# Patient Record
Sex: Female | Born: 1935 | Race: Black or African American | Hispanic: No | Marital: Single | Smoking: Former smoker
Health system: Southern US, Community
[De-identification: ages and names within clinical notes are randomized; demographics above are authoritative.]

## PROBLEM LIST (undated history)

## (undated) ENCOUNTER — Emergency Department (HOSPITAL_COMMUNITY): Admission: EM | Discharge status: Against Medical Advice | Payer: Medicare Other

## (undated) DIAGNOSIS — I10 Essential (primary) hypertension: Secondary | ICD-10-CM

## (undated) DIAGNOSIS — C801 Malignant (primary) neoplasm, unspecified: Secondary | ICD-10-CM

## (undated) HISTORY — PX: ABDOMINAL HYSTERECTOMY: SHX81

## (undated) HISTORY — PX: CERVICAL FUSION: SHX112

## (undated) HISTORY — PX: BREAST LUMPECTOMY: SHX2

## (undated) HISTORY — PX: OTHER SURGICAL HISTORY: SHX169

---

## 1998-11-04 ENCOUNTER — Ambulatory Visit (HOSPITAL_COMMUNITY): Admission: RE | Admit: 1998-11-04 | Discharge: 1998-11-04 | Payer: Self-pay | Admitting: Gastroenterology

## 1998-11-23 ENCOUNTER — Ambulatory Visit (HOSPITAL_COMMUNITY): Admission: RE | Admit: 1998-11-23 | Discharge: 1998-11-23 | Payer: Self-pay | Admitting: Gastroenterology

## 1998-11-23 ENCOUNTER — Encounter: Payer: Self-pay | Admitting: Gastroenterology

## 1999-04-04 ENCOUNTER — Emergency Department (HOSPITAL_COMMUNITY): Admission: EM | Admit: 1999-04-04 | Discharge: 1999-04-04 | Payer: Self-pay | Admitting: Emergency Medicine

## 1999-04-04 ENCOUNTER — Encounter: Payer: Self-pay | Admitting: Emergency Medicine

## 1999-04-06 ENCOUNTER — Emergency Department (HOSPITAL_COMMUNITY): Admission: EM | Admit: 1999-04-06 | Discharge: 1999-04-06 | Payer: Self-pay | Admitting: Emergency Medicine

## 1999-04-14 ENCOUNTER — Emergency Department (HOSPITAL_COMMUNITY): Admission: EM | Admit: 1999-04-14 | Discharge: 1999-04-14 | Payer: Self-pay | Admitting: Emergency Medicine

## 2000-03-27 ENCOUNTER — Ambulatory Visit (HOSPITAL_COMMUNITY): Admission: RE | Admit: 2000-03-27 | Discharge: 2000-03-27 | Payer: Self-pay | Admitting: Gastroenterology

## 2000-03-27 ENCOUNTER — Encounter (INDEPENDENT_AMBULATORY_CARE_PROVIDER_SITE_OTHER): Payer: Self-pay | Admitting: Specialist

## 2001-07-07 ENCOUNTER — Encounter: Admission: RE | Admit: 2001-07-07 | Discharge: 2001-07-07 | Payer: Self-pay | Admitting: Internal Medicine

## 2001-07-07 ENCOUNTER — Encounter: Payer: Self-pay | Admitting: Internal Medicine

## 2001-07-07 ENCOUNTER — Encounter: Admission: RE | Admit: 2001-07-07 | Discharge: 2001-10-05 | Payer: Self-pay | Admitting: Internal Medicine

## 2002-04-07 ENCOUNTER — Encounter: Payer: Self-pay | Admitting: Neurosurgery

## 2002-04-07 ENCOUNTER — Ambulatory Visit (HOSPITAL_COMMUNITY): Admission: RE | Admit: 2002-04-07 | Discharge: 2002-04-07 | Payer: Self-pay | Admitting: Neurosurgery

## 2002-05-05 ENCOUNTER — Inpatient Hospital Stay (HOSPITAL_COMMUNITY): Admission: RE | Admit: 2002-05-05 | Discharge: 2002-05-07 | Payer: Self-pay | Admitting: Neurosurgery

## 2002-05-05 ENCOUNTER — Encounter: Payer: Self-pay | Admitting: Neurosurgery

## 2003-05-24 ENCOUNTER — Ambulatory Visit (HOSPITAL_COMMUNITY): Admission: RE | Admit: 2003-05-24 | Discharge: 2003-05-24 | Payer: Self-pay | Admitting: Gastroenterology

## 2004-07-08 ENCOUNTER — Inpatient Hospital Stay (HOSPITAL_COMMUNITY): Admission: EM | Admit: 2004-07-08 | Discharge: 2004-07-11 | Payer: Self-pay | Admitting: Emergency Medicine

## 2008-02-12 ENCOUNTER — Encounter (INDEPENDENT_AMBULATORY_CARE_PROVIDER_SITE_OTHER): Payer: Self-pay | Admitting: *Deleted

## 2008-02-12 ENCOUNTER — Ambulatory Visit (HOSPITAL_COMMUNITY): Admission: RE | Admit: 2008-02-12 | Discharge: 2008-02-12 | Payer: Self-pay | Admitting: *Deleted

## 2008-02-16 ENCOUNTER — Ambulatory Visit (HOSPITAL_COMMUNITY): Admission: RE | Admit: 2008-02-16 | Discharge: 2008-02-16 | Payer: Self-pay | Admitting: *Deleted

## 2010-08-25 ENCOUNTER — Emergency Department (HOSPITAL_COMMUNITY): Admission: EM | Admit: 2010-08-25 | Discharge: 2010-08-25 | Payer: Self-pay | Admitting: Family Medicine

## 2010-12-26 LAB — POCT URINALYSIS DIPSTICK
Bilirubin Urine: NEGATIVE
Ketones, ur: NEGATIVE mg/dL
Nitrite: POSITIVE — AB
Protein, ur: NEGATIVE mg/dL
Urobilinogen, UA: 0.2 mg/dL (ref 0.0–1.0)

## 2011-02-27 NOTE — Op Note (Signed)
NAMECARIGAN, Penny NO.:  [id number]   MEDICAL RECORD NO.:  [id number]          PATIENT TYPE:  AMB   LOCATION:  ENDO                         FACILITY:  [facility]   PHYSICIAN:  Georgiana Spinner, M.D.    DATE OF BIRTH:  Oct 20, 1933   DATE OF PROCEDURE:  DATE OF DISCHARGE:                               OPERATIVE REPORT   PROCEDURE:  Colonoscopy.   INDICATIONS:  Left upper quadrant abdominal pain.   ANESTHESIA:  Fentanyl 25 mcg, Versed 3 mg.   PROCEDURE:  With the patient mildly sedated in the left lateral  decubitus position, the Pentax videoscopic colonoscope was inserted in  the rectum, passed under direct vision with pressure applied. We reached  the cecum identified by the ileocecal valve and appendiceal orifice both  of which were photographed. From this point, the colonoscope was slowly  withdrawn taking circumferential views of colonic mucosa stopping only  at approximately 25 cm from the anal verge at which point a polyp was  seen, photographed and removed using hot biopsy forceps technique on a  setting of 20/150 blended current.  We next stopped in the rectum which  appeared normal on direct and showed hemorrhoids on retroflexed view  which was also removed in the same manner. The endoscope was  straightened and withdrawn. The patient's vital signs and pulse oximeter  remained stable. The patient tolerated the procedure well without  apparent complications.   FINDINGS:  Polyp of rectum, polyp at 25-30 cm from the anal verge.   PLAN:  Await biopsy report.  The patient will call me for results and  follow-up with me as needed as an outpatient.  Additionally, we will  plan on doing a CT scan of the abdomen to evaluate her left upper  quadrant pain further. The patient is status post breast cancer.           ______________________________  Georgiana Spinner, M.D.     GMO/MEDQ  D:  02/12/2008  T:  02/12/2008  Job:  [id number]

## 2011-02-27 NOTE — Op Note (Signed)
NAMECYRIAH, CHILDREY NO.:  [id number]   MEDICAL RECORD NO.:  [id number]          PATIENT TYPE:  AMB   LOCATION:  ENDO                         FACILITY:  [facility]   PHYSICIAN:  Georgiana Spinner, M.D.    DATE OF BIRTH:  [birth year]   DATE OF PROCEDURE:  02/12/2008  DATE OF DISCHARGE:                               OPERATIVE REPORT   PROCEDURE:  Upper endoscopy.   INDICATIONS:  Left upper quadrant abdominal pain.   ANESTHESIA:  Fentanyl 50 mcg, Versed 5 mg.   PROCEDURE IN DETAIL:  With the patient mildly sedated in the left  lateral decubitus position the Pentax videoscopic endoscope was inserted  in the mouth, passed under direct vision through the esophagus which  appeared normal into the stomach, fundus, body, antrum, duodenal bulb,  second portion of duodenum were visualized and all appeared normal.  From this point the endoscope was slowly withdrawn taking  circumferential views of duodenal mucosa until the endoscope had been  pulled back into the stomach and placed in retroflexion to view the  stomach from below.  The endoscope was straightened and withdrawn taking  circumferential views of remaining gastric and esophageal mucosa.  The  patient's vital signs, pulse oximeter remained stable.  The patient  tolerated the procedure well without apparent complication.   FINDINGS:  Negative examination.   PLAN:  Proceed to colonoscopy.           ______________________________  Georgiana Spinner, M.D.     GMO/MEDQ  D:  02/12/2008  T:  02/12/2008  Job:  [id number]   cc:   Candyce Churn. Allyne Gee, M.D.  Fax: [phone]

## 2011-03-02 NOTE — Op Note (Signed)
[name], Mathews               ACCOUNT NO.:  [id number]   MEDICAL RECORD NO.:  [id number]          PATIENT TYPE:  INP   LOCATION:  1825                         FACILITY:  [facility]   PHYSICIAN:  Lubertha Basque. Dalldorf, M.D.DATE OF BIRTH:  22-Mar-1934   DATE OF PROCEDURE:  07/08/2004  DATE OF DISCHARGE:                                 OPERATIVE REPORT   PREOPERATIVE DIAGNOSES:  1.  Left tibia and fibula shaft fractures.  2.  Left ankle lateral malleolus fracture.   POSTOPERATIVE DIAGNOSES:  1.  Left tibia and fibula shaft fractures.  2.  Left ankle lateral malleolus fracture.   PROCEDURE:  1.  Left tibia IM nail.  2.  ORIF left ankle.   ANESTHESIA:  General.   ATTENDING SURGEON:  Lubertha Basque. Jerl Santos, M.D.   ASSISTANT:  Prince Rome, P.A.   INDICATION FOR PROCEDURE:  The patient is a 77 year old woman, who fell off  a ladder this afternoon and sustained mid shaft tibia and fibula fractures  along with a distal fibula fracture.  She had a significantly displaced  ankle fracture along with her tibial shaft fracture and was offered  operative intervention to consist of intramedullary nailing of the tibia and  ORIF of the ankle.  Informed operative consent was obtained after discussion  of possible complications of reaction to anesthesia, infection,  neurovascular injury, stiffness, and nonunion.   DESCRIPTION OF PROCEDURE:  The patient was taken to the operating suite  where general anesthetic was applied without difficulty.  She was positioned  supine and prepped and draped in the normal sterile fashion.  After the  administration of IV antibiotics, the proximal tibia was approached through  a medial incision along the border of the patellar tendon.  Dissection was  carried down to an area just proximal to the tibial tubercle.  An awl was  used to enter the tibia following placement of a guidewire.  This was then  overreamed with some difficulty up to 11 mm.  She had a very  tight canal  even with the 9 mm reamer.  We then placed a __________ tibial nail which  was 10 mm in diameter and 32 cm in length.  We then locked this proximally  with two screws and distally with two screws with bicortical purchase  achieved on each.  I used fluoroscopy throughout this portion of the case to  make appropriate intraoperative incisions __________ myself.  She then had  an unstable ankle fracture.  I performed a lateral approach to the distal  fibula.  We aligned this as well as we could and then stabilized it with a 6-  hole locking plate from [employer].  Five of the screw holes with two distal  and three proximal screws.  Again, I used fluoroscopy to confirm adequate  reduction of this fracture and placed it within the hardware and read all  these views myself.  The wounds were then irrigated followed by  reapproximation of deep tissues with Vicryl and skin with staples.  Adaptic  was placed over the various wounds followed by dry gauze and a  loose Ace  wrap.  We did place a posterior splint of plaster on the ankle with the  joint in neutral position.  Estimated blood loss and intraoperative fluids  can be obtained from the anesthesia records.  No tourniquet was inflated.   DISPOSITION:  The patient was extubated in the operating room and taken to  the recovery room in stable condition.  Plans were for her to be admitted to  the orthopedic surgery service for appropriate postop care to include  perioperative antibiotics and a brief course of Lovenox for DVT prophylaxis.       PGD/MEDQ  D:  07/08/2004  T:  07/09/2004  Job:  [id number]

## 2011-03-02 NOTE — Op Note (Signed)
[facility]  Patient:    Penny Mathews, Penny Mathews I Visit Number: [id number] MRN: [id number]          Service Type: SUR Location: 3000 3020 [id number] Attending Physician:  Josie Saunders Dictated by:   Danae Orleans. Venetia Maxon, M.D. Proc. Date: 05/05/02 Admit Date:  05/05/2002 Discharge Date: 05/07/2002                             Operative Report  PREOPERATIVE DIAGNOSIS:  Herniated cervical disk with cervical spondylosis, degenerative disk disease, and radiculopathy at C3-4, C4-5, and C6-7, and prior fusion at C5-6.  POSTOPERATIVE DIAGNOSIS:  Herniated cervical disk with cervical spondylosis, degenerative disk disease, and radiculopathy at C3-4, C4-5, and C6-7, and prior fusion at C5-6.  PROCEDURE:  Anterior cervical diskectomy and fusion at C3-4, C4-5, and C6-7, with allograft bone graft and anterior cervical plate.  ADDITIONAL PROCEDURES:  Exploration and fusion at C5-6.  SURGEON:  Danae Orleans. Venetia Maxon, M.D.  ASSISTANTMarland Kitchen  Mena Goes. Franky Macho, M.D.  ANESTHESIA:  General endotracheal.  ESTIMATED BLOOD LOSS:  100 cc.  COMPLICATIONS:  None.  DISPOSITION:  To recovery.  INDICATIONS:  The patient is a 77 year old woman with a multi-level cervical spondylitic foraminal stenosis and cervical radiculopathy, who had previously undergone in the distant past a C5-6 anterior cervical fusion.  It was elected after she did not improve with prolonged conservative management to take her to surgery for anterior cervical diskectomy and fusion at the effected level.  DESCRIPTION OF PROCEDURE:  The patient was brought to the operating room. Following the satisfactory and uncomplicated induction of general endotracheal anesthesia and placement of intravenous lines and a Foley catheter, the patient was placed in the supine position on the operating table.  The neck was placed in slight extension.  She was placed in 10 pounds of halter traction.  Her anterior neck was then prepped  and draped in the usual sterile fashion.  An incision was marked on the left side of the neck, along the anterior border of the sternocleidomastoid muscle which was then infiltrated with 0.25% Marcaine, 0.5% lidocaine, and 1:200,000 epinephrine.  An incision was made and carried through the platysmal layer.  Subplatysmal dissection was performed and exposure was made of the anterior cervical spine.  Carotid sheath kept lateral.  Trachea and esophagus kept medial.  Initial needle was placed at what was felt to be the C4-5 level and this was confirmed on intraoperative x-ray.  Subsequently, the longus colli muscles were taken down from C3 to C5, and then from C6 to C7 bilaterally using electrocautery and Key elevator.  The C5-6 level was inspected and the fusion was explored and was found to be solid arthrodesis at this level.  Initially, at the C6-7 level, there was a large ventral osteophyte which was removed with Leksell rongeur.  Self-retaining retractors were placed to facilitate exposure.  The disk space was incised with a 15 blade.  Disk material was removed in a piecemeal fashion.  The endplates were stripped of cartilaginous and disk material with a variety of Carlens curets.  Attention was then turned to the C3-4 and C4-5 levels where similar exposure was performed.  Self-retaining retractor was placed along with up/down retractor.  The microscope was brought into the field and using the high speed drill and microscopic visualization, the endplates were decorticated at C3 and C4, and large uncinate spurs were drilled down.  This was particularly large on the  right side.  The posterior longitudinal ligament was then incised with the arachnoid knife and removed in a piecemeal fashion.  Both C4 neural foraminae were well-decompressed.  Hemostasis was assured and Gelfoam soaked in thrombin.  A trial sizer was used and found that a 7 mm graft would fit well.  An 8 mm graft was then  thinned to a thickness of 7 mm and inserted and counter sunk appropriately at the interspace.  Attention was then turned to the 4-5 level where a similar decompression was performed, and again, at this level, large bilateral uncinate spurs were identified, and drilled down, and the posterior longitudinal ligament incised and removed in a piecemeal fashion.  The neural foraminae were felt to be well-decompressed.  Care was taken not to instrument the neural foraminae out of concern for fragility of the C5 nerve roots, but both neural foraminae were well-decompressed and the large uncinate spurs were removed.  Hemostasis was again assured with Gelfoam soaked in thrombin and a similarly-sized bone graft was inserted and counter sunk appropriately.  Attention was then returned to the C6-7 level, where a disk space spreader was placed, and at this level, large uncinate spurs were identified, and drilled down under microscopic visualization.  The posterior longitudinal ligament was removed and the neural foraminae were decompressed bilaterally.  An 8 mm bone graft was then inserted and counter sunk appropriately at this level.  The anterior spine was then prepared for plating.  The microscope was taken out of the field, and initially a 26 mm Trinica anterior cervical plate was affixed to the cervical spine with two 14 mm screws at C6, two 14 mm variable screws at C7.  Locking mechanisms were engaged.  All the screws had excellent purchase.  A 44 mm plate was then affixed to the anterior spine at C3 through C5 level using similarly-sized screws.  All screws had excellent purchase. Final x-ray confirmed good position of bone graft and anterior cervical plate. The wound was copiously irrigated with Bacitracin and saline.  Soft tissues were reinspected and found to be in good repair.  The final x-ray demonstrates good position of bone graft and anterior cervical plate.  Then, the platysmal layer  was closed with 3-0 Vicryl sutures, and skin edges were reapproximated with running 2-0 Vicryl subcuticular stitch.   The wound  was dressed with Dermabond.  The patient was extubated in the operating room and taken to the recovery room in stable and satisfactory condition.  At the end of the operation, all counts were correct. Dictated by:   Danae Orleans Venetia Maxon, M.D. Attending Physician:  Josie Saunders DD:  05/05/02 TD:  05/08/02 Job: [id number]

## 2011-03-02 NOTE — Procedures (Signed)
[facility]  Patient:    Penny Mathews, Penny Mathews                   MRN: [id number] Proc. Date: 03/27/00 Adm. Date:  [id number] Disc. Date: [id number] Attending:  Louie Bun CC:         Julieanne Manson, M.D.                           Procedure Report  PROCEDURE:  Colonoscopy.  INDICATION FOR PROCEDURE:  History of adenomatous colon polyps 3 years ago.  DESCRIPTION OF PROCEDURE:  The patient was placed in the left lateral decubitus position and placed on the pulse monitor with continuous low flow oxygen delivered by nasal cannula. She was sedated with 50 mg IV Demerol and 5 mg IV Versed. The Olympus video colonoscope was inserted into the rectum and advanced to the cecum, confirmed by transillumination of McBurneys point and visualization of the ileocecal valve and appendiceal orifice. The prep was excellent. The cecum and ascending colon appeared normal with no masses, polyps, diverticula or other mucosal abnormalities. Within the transverse colon, there was seen an 8 mm polyp which was fulgurated by hot biopsy. Three additional polyps were seen scattered in the descending sigmoid and rectum and also removed by hot biopsy. These ranged in size from 0.42 to 1 cm in diameter. There may have been 2 or 3 diminutive rectal polyps that were not fulgurated. The colonoscope was then withdrawn and the patient returned to the recovery room in stable condition. The patient tolerated the procedure well and there were no immediate complications.  IMPRESSION:  Multiple small colon polyps from the transverse colon to the rectum.  PLAN:  Await histology and will probably repeat colonoscopy in 3 years. DD:  03/27/00 TD:  03/30/00 Job: [id number]

## 2011-03-02 NOTE — Op Note (Signed)
   NAME:  Penny Mathews, Penny Mathews                         ACCOUNT NO.:  [id number]   MEDICAL RECORD NO.:  [id number]                   PATIENT TYPE:  AMB   LOCATION:  ENDO                                 [facility]ACILITY[facility]  [facility]   PHYSICIAN:  John C. Madilyn Fireman, M.D.                 DATE OF BIRTH:  21-Aug-1934   DATE OF PROCEDURE:  05/24/2003  DATE OF DISCHARGE:                                 OPERATIVE REPORT   PROCEDURE:  Colonoscopy.   INDICATION FOR PROCEDURE:  History of adenomatous colon polyps, last  colonoscopy three years ago.   DESCRIPTION OF PROCEDURE:  The patient was placed in the left lateral  decubitus position and placed on the pulse monitor with continuous low-flow  oxygen delivered by nasal cannula.  She was sedated with 87.5 mcg of IV  fentanyl and 9 mg of IV Versed.  The Olympus video colonoscope was inserted  into the rectum and advanced to the cecum, confirmed by transillumination at  McBurney's point and visualization of the ileocecal valve and appendiceal  orifice.  The prep was excellent.  The cecum, ascending, transverse,  descending, and sigmoid colon all appeared normal with no masses, polyps,  diverticula or other mucosal abnormalities.  The rectum likewise appeared  normal and a retroflex view of the anus revealed no obvious internal  hemorrhoids.  The scope was then withdrawn and the patient returned to the  recovery room in stable condition, she tolerated the procedure well and  there were no immediate complications.   IMPRESSION:  Normal colonoscopy.   PLAN:  Repeat study in five years.                                                John C. Madilyn Fireman, M.D.    JCH/MEDQ  D:  05/24/2003  T:  05/24/2003  Job:  [id number]

## 2011-03-02 NOTE — Discharge Summary (Signed)
NAMEEUGENA, RHUE               ACCOUNT NO.:  [id number]   MEDICAL RECORD NO.:  [id number]          PATIENT TYPE:  INP   LOCATION:  5038                         FACILITY:  [facility]   PHYSICIAN:  Lubertha Basque. Dalldorf, M.D.DATE OF BIRTH:  1934-05-28   DATE OF ADMISSION:  07/08/2004  DATE OF DISCHARGE:  07/11/2004                                 DISCHARGE SUMMARY   ADMISSION DIAGNOSIS:  Left tibia-fibula fracture and left ankle fracture.   DISCHARGE DIAGNOSIS:  Left tibia-fibula fracture and left ankle fracture.   OPERATIONS:  Intramedullary rodding of left tibia and open reduction and  internal fixation of left lateral malleolus.   BRIEF HISTORY:  Ms. Mounsey is a 77 year old black female who was up on a  ladder trimming her trees the day of admission to the hospital when she lost  her balance and fell, injuring her left leg.  She was transported over to  [facility] Emergency Room.  X-rays were taken.  She had a left  tibia-fibula fracture and a lateral malleolus fracture of her left ankle.  Discussed treatment options with the patient, that being ORIF and IM rodding  of these above-mentioned fractures.   IMPORTANT LABORATORY AND X-RAY FINDINGS:  Her left tibia and fibula shows a  displaced fracture of the left tibia with fractures of the left fibula, as  well.  No active lung disease.  Postoperatively, there were films taken in  the operating room with the C arm which showed intramedullary rod placement  for her tibia fracture.  Shoulder x-ray was taken because of some shoulder  pain which was normal.  No bony abnormality noted.  WBCs were 7.4,  hemoglobin 14.1, hematocrit 40.  Pro time 12.2, INR 0.9.  Potassium 3.7,  sodium 140, glucose 104, BUN 7, creatinine 1.0.   HOSPITAL COURSE:  Postoperatively, she was on IV lactated Ringer's, 80 cubic  centimeters an hour, until her p.o.'s were okay and then reduced to [facility] and  Ancef 1 g IV q.8h. x 3 doses.  PCA, morphine pump, and  Tylox by mouth for  pain control, Phenergan for nausea, leg left side, ice, elevation.  She can  be nonweightbearing on her left leg with crutches or walker and an I&O  catheter p.r.n.Marland Kitchen  Physical therapy was ordered, as well, for  nonweightbearing gait with crutches or walker.  The first day  postoperatively, blood pressure was 130/72, heart rate 70, temperature 97.  Lungs were clear, abdomen was soft.  Left leg with minimal drainage, good  neurovascular status, no sign of infection.  The second day postoperatively,  her temperature was 98, blood pressure 152/81.  Lungs were clear.  Abdomen  was soft.  No sign of left leg infection.  Dressing was dry.  Good  neurovascular status to her toes.  We had planned discharge.  On 9/27, she  was complaining of some right shoulder discomfort, and we had ordered x-rays  which were read out as normal.  On that day, her blood pressure was 118/67,  lungs were clear, abdomen was soft.  Incisions looked fine, no sign of  infection or irritation in her left lower extremity.  The right shoulder had  discomfort and a positive secondary impingement, 5+/5 rotator cuff strength,  and normal neurovascular status to her fingertips.  She was then discharged  home.   CONDITION ON DISCHARGE:  She was discharged on Percocet one or two every 4-6  hours for pain.  Right shoulder - advised for ice on her shoulder, Advil one  or two every 6 hours.  Should leave her splint on her leg and  not change her dressing.  She can be on a walker or crutches with no weight  on her left leg, any signs of infection, redness, drainage, increasing pain,  to call [phone].  Otherwise, we are going to see her in  our office in 5-7  days, also calling that number, [phone], for an appointment.  Diet was  unrestricted.       MC/MEDQ  D:  08/03/2004  T:  08/03/2004  Job:  [id number]

## 2013-04-11 ENCOUNTER — Emergency Department (HOSPITAL_COMMUNITY)
Admission: EM | Admit: 2013-04-11 | Discharge: 2013-04-11 | Disposition: A | Discharge status: Home / Self Care | Payer: Medicare Other | Source: Home / Self Care | Attending: Emergency Medicine | Admitting: Emergency Medicine

## 2013-04-11 ENCOUNTER — Encounter (HOSPITAL_COMMUNITY): Payer: Self-pay | Admitting: Emergency Medicine

## 2013-04-11 ENCOUNTER — Emergency Department (INDEPENDENT_AMBULATORY_CARE_PROVIDER_SITE_OTHER): Payer: Medicare Other

## 2013-04-11 DIAGNOSIS — J309 Allergic rhinitis, unspecified: Secondary | ICD-10-CM

## 2013-04-11 DIAGNOSIS — R05 Cough: Secondary | ICD-10-CM

## 2013-04-11 LAB — POCT RAPID STREP A: Streptococcus, Group A Screen (Direct): NEGATIVE

## 2013-04-11 MED ORDER — FEXOFENADINE-PSEUDOEPHED ER 60-120 MG PO TB12: 1.0000 | ORAL_TABLET | Freq: Two times a day (BID) | ORAL | Status: DC

## 2013-04-11 MED ORDER — TRIAMCINOLONE ACETONIDE(NASAL) 55 MCG/ACT NA INHA: 2.0000 | Freq: Every day | NASAL | Status: DC

## 2013-04-11 MED ORDER — HYDROCOD POLST-CHLORPHEN POLST 10-8 MG/5ML PO LQCR: 5.0000 mL | Freq: Two times a day (BID) | ORAL | Status: DC | PRN

## 2013-04-11 NOTE — ED Notes (Signed)
Pt c/o sore throat and chest congestion x 1 wk. Pt states that pain in throat is gradually getting worse. Pt has used otc meds with no relief. Denies fever and any  Other symptoms.

## 2013-04-13 LAB — CULTURE, GROUP A STREP

## 2013-04-13 NOTE — ED Provider Notes (Signed)
History    CSN: [id number] Arrival date & time 04/11/13  1538  First MD Initiated Contact with Patient 04/11/13 1709     Chief Complaint  Patient presents with  . Sore Throat    x 1 with and chest congestion.    (Consider location/radiation/quality/duration/timing/severity/associated sxs/prior Treatment) HPI  79 yo female presented with the above complaint.  Sore throat and chest congestion worsening over the last couple of days.  She has also been having watery eyes, runny nose, nasal congesion and postnasal drip.  Hx of seasonal allergies.  Denies productive cough, cp, sob, fever, chills.   History reviewed. No pertinent past medical history. History reviewed. No pertinent past surgical history. History reviewed. No pertinent family history. History  Substance Use Topics  . Smoking status: Current Every Day Smoker -- 1.00 packs/day    Types: Cigarettes  . Smokeless tobacco: Not on file  . Alcohol Use: No   OB History   Grav Para Term Preterm Abortions TAB SAB Ect Mult Living                 Review of Systems  Constitutional: Negative.   HENT: Positive for congestion, rhinorrhea and postnasal drip. Negative for sneezing.   Eyes: Positive for itching. Negative for pain, discharge and redness.  Respiratory: Positive for cough.   Cardiovascular: Negative.   Gastrointestinal: Negative.   Endocrine: Negative.   Genitourinary: Negative.   Musculoskeletal: Negative.   Skin: Negative.   Allergic/Immunologic: Positive for environmental allergies.  Neurological: Negative.   Hematological: Negative.   Psychiatric/Behavioral: Negative.     Allergies  Review of patient's allergies indicates no known allergies.  Home Medications   Current Outpatient Rx  Name  Route  Sig  Dispense  Refill  . rosuvastatin (CRESTOR) 5 MG tablet   Oral   Take 5 mg by mouth daily.         . chlorpheniramine-HYDROcodone (TUSSIONEX PENNKINETIC ER) 10-8 MG/5ML LQCR   Oral   Take 5 mLs by  mouth every 12 (twelve) hours as needed.   140 mL   0     Only take this medication while at home.  Do not d ...   . fexofenadine-pseudoephedrine (ALLEGRA-D) 60-120 MG per tablet   Oral   Take 1 tablet by mouth every 12 (twelve) hours.   30 tablet   0   . triamcinolone (NASACORT AQ) 55 MCG/ACT nasal inhaler   Nasal   Place 2 sprays into the nose daily.   1 Inhaler   1     Both nostrils    Pulse 63  Temp(Src) 98.6 F (37 C) (Oral)  Resp 16  SpO2 96% Physical Exam  Constitutional: She is oriented to person, place, and time. She appears well-developed and well-nourished.  HENT:  Head: Normocephalic and atraumatic.  Mouth/Throat: Oropharynx is clear and moist.  Eyes: EOM are normal. Pupils are equal, round, and reactive to light. Scleral icterus is present.  Neck: Normal range of motion. Neck supple.  Cardiovascular: Normal rate and regular rhythm.   Pulmonary/Chest: Effort normal and breath sounds normal.  Abdominal: Soft. Bowel sounds are normal.  Musculoskeletal: Normal range of motion.  Lymphadenopathy:    She has no cervical adenopathy.  Neurological: She is oriented to person, place, and time.  Skin: Skin is warm.  Psychiatric: She has a normal mood and affect.    ED Course  Procedures (including critical care time) Labs Reviewed  CULTURE, GROUP A STREP  POCT RAPID STREP A (  [facility] ONLY)   Dg Chest 2 View  04/11/2013   *RADIOLOGY REPORT*  Clinical Data: Cough and congestion  CHEST - 2 VIEW  Comparison: 07/08/2004  Findings: The heart is mildly enlarged.  The aorta is unfolded. The lungs are clear.  No effusions.  Surgical clips in the left axilla.  Ordinary degenerative changes effect the spine.  IMPRESSION: No active disease evident.  Borderline cardiomegaly.  Unfolded aorta.  Left chest surgery.   Original Report Authenticated By: Paulina Fusi, M.D.   1. Allergic rhinitis   2. Cough     MDM  Patient will f/u in clinic in a few days if symptoms worsen  or not improved.  Instructions given and she voices understanding.    Meds ordered this encounter  Medications  . rosuvastatin (CRESTOR) 5 MG tablet    Sig: Take 5 mg by mouth daily.  . fexofenadine-pseudoephedrine (ALLEGRA-D) 60-120 MG per tablet    Sig: Take 1 tablet by mouth every 12 (twelve) hours.    Dispense:  30 tablet    Refill:  0  . chlorpheniramine-HYDROcodone (TUSSIONEX PENNKINETIC ER) 10-8 MG/5ML LQCR    Sig: Take 5 mLs by mouth every 12 (twelve) hours as needed.    Dispense:  140 mL    Refill:  0    Only take this medication while at home.  Do not drive when using.  . triamcinolone (NASACORT AQ) 55 MCG/ACT nasal inhaler    Sig: Place 2 sprays into the nose daily.    Dispense:  1 Inhaler    Refill:  1    Both nostrils    Zonia Kief, PA-C 04/13/13 [phone]

## 2013-04-13 NOTE — ED Provider Notes (Signed)
Medical screening examination/treatment/procedure(s) were performed by non-physician practitioner and as supervising physician I was immediately available for consultation/collaboration.  Leslee Home, M.D.  Reuben Likes, MD 04/13/13 2147

## 2013-11-07 ENCOUNTER — Encounter (HOSPITAL_COMMUNITY): Payer: Self-pay | Admitting: Emergency Medicine

## 2013-11-07 ENCOUNTER — Inpatient Hospital Stay (HOSPITAL_COMMUNITY)
Admission: EM | Admit: 2013-11-07 | Discharge: 2013-11-09 | DRG: 069 | Disposition: A | Discharge status: Home / Self Care | Payer: Medicare HMO | Attending: Internal Medicine | Admitting: Internal Medicine

## 2013-11-07 ENCOUNTER — Inpatient Hospital Stay (HOSPITAL_COMMUNITY): Payer: Medicare HMO

## 2013-11-07 ENCOUNTER — Emergency Department (HOSPITAL_COMMUNITY): Payer: Medicare HMO

## 2013-11-07 ENCOUNTER — Emergency Department (HOSPITAL_COMMUNITY)
Admission: EM | Admit: 2013-11-07 | Discharge: 2013-11-07 | Disposition: A | Discharge status: Nursing Home (Includes ICF) | Payer: Medicare HMO | Source: Home / Self Care | Attending: Emergency Medicine | Admitting: Emergency Medicine

## 2013-11-07 DIAGNOSIS — M542 Cervicalgia: Secondary | ICD-10-CM

## 2013-11-07 DIAGNOSIS — I639 Cerebral infarction, unspecified: Secondary | ICD-10-CM | POA: Diagnosis present

## 2013-11-07 DIAGNOSIS — Z853 Personal history of malignant neoplasm of breast: Secondary | ICD-10-CM

## 2013-11-07 DIAGNOSIS — Z9221 Personal history of antineoplastic chemotherapy: Secondary | ICD-10-CM

## 2013-11-07 DIAGNOSIS — G459 Transient cerebral ischemic attack, unspecified: Principal | ICD-10-CM | POA: Diagnosis present

## 2013-11-07 DIAGNOSIS — Z823 Family history of stroke: Secondary | ICD-10-CM

## 2013-11-07 DIAGNOSIS — R519 Headache, unspecified: Secondary | ICD-10-CM

## 2013-11-07 DIAGNOSIS — F17200 Nicotine dependence, unspecified, uncomplicated: Secondary | ICD-10-CM | POA: Diagnosis present

## 2013-11-07 DIAGNOSIS — Z923 Personal history of irradiation: Secondary | ICD-10-CM

## 2013-11-07 DIAGNOSIS — R413 Other amnesia: Secondary | ICD-10-CM

## 2013-11-07 DIAGNOSIS — I6350 Cerebral infarction due to unspecified occlusion or stenosis of unspecified cerebral artery: Secondary | ICD-10-CM

## 2013-11-07 DIAGNOSIS — R51 Headache: Secondary | ICD-10-CM

## 2013-11-07 DIAGNOSIS — I10 Essential (primary) hypertension: Secondary | ICD-10-CM

## 2013-11-07 DIAGNOSIS — Z7982 Long term (current) use of aspirin: Secondary | ICD-10-CM

## 2013-11-07 DIAGNOSIS — Z79899 Other long term (current) drug therapy: Secondary | ICD-10-CM

## 2013-11-07 HISTORY — DX: Essential (primary) hypertension: I10

## 2013-11-07 HISTORY — DX: Malignant (primary) neoplasm, unspecified: C80.1

## 2013-11-07 LAB — DIFFERENTIAL
BASOS PCT: 0 % (ref 0–1)
Basophils Absolute: 0 10*3/uL (ref 0.0–0.1)
EOS ABS: 0.1 10*3/uL (ref 0.0–0.7)
EOS PCT: 1 % (ref 0–5)
LYMPHS ABS: 1.9 10*3/uL (ref 0.7–4.0)
Lymphocytes Relative: 31 % (ref 12–46)
MONOS PCT: 6 % (ref 3–12)
Monocytes Absolute: 0.3 10*3/uL (ref 0.1–1.0)
Neutro Abs: 3.8 10*3/uL (ref 1.7–7.7)
Neutrophils Relative %: 62 % (ref 43–77)

## 2013-11-07 LAB — URINALYSIS, ROUTINE W REFLEX MICROSCOPIC
Bilirubin Urine: NEGATIVE
GLUCOSE, UA: NEGATIVE mg/dL
KETONES UR: NEGATIVE mg/dL
Nitrite: NEGATIVE
Protein, ur: NEGATIVE mg/dL
Specific Gravity, Urine: 1.007 (ref 1.005–1.030)
Urobilinogen, UA: 0.2 mg/dL (ref 0.0–1.0)
pH: 7.5 (ref 5.0–8.0)

## 2013-11-07 LAB — COMPREHENSIVE METABOLIC PANEL
ALBUMIN: 4 g/dL (ref 3.5–5.2)
ALT: 20 U/L (ref 0–35)
AST: 26 U/L (ref 0–37)
Alkaline Phosphatase: 92 U/L (ref 39–117)
BUN: 9 mg/dL (ref 6–23)
CALCIUM: 9.1 mg/dL (ref 8.4–10.5)
CO2: 27 mEq/L (ref 19–32)
CREATININE: 0.88 mg/dL (ref 0.50–1.10)
Chloride: 102 mEq/L (ref 96–112)
GFR calc non Af Amer: 62 mL/min — ABNORMAL LOW (ref >90)
GFR, EST AFRICAN AMERICAN: 72 mL/min — AB (ref >90)
GLUCOSE: 117 mg/dL — AB (ref 70–99)
Potassium: 3.9 mEq/L (ref 3.7–5.3)
Sodium: 143 mEq/L (ref 137–147)
TOTAL PROTEIN: 7.5 g/dL (ref 6.0–8.3)
Total Bilirubin: 0.4 mg/dL (ref 0.3–1.2)

## 2013-11-07 LAB — PROTIME-INR
INR: 0.97 (ref 0.00–1.49)
PROTHROMBIN TIME: 12.7 s (ref 11.6–15.2)

## 2013-11-07 LAB — POCT I-STAT, CHEM 8
BUN: 8 mg/dL (ref 6–23)
CALCIUM ION: 1.13 mmol/L (ref 1.13–1.30)
Chloride: 102 mEq/L (ref 96–112)
Creatinine, Ser: 1 mg/dL (ref 0.50–1.10)
Glucose, Bld: 117 mg/dL — ABNORMAL HIGH (ref 70–99)
HCT: 44 % (ref 36.0–46.0)
Hemoglobin: 15 g/dL (ref 12.0–15.0)
Potassium: 3.7 mEq/L (ref 3.7–5.3)
Sodium: 141 mEq/L (ref 137–147)
TCO2: 29 mmol/L (ref 0–100)

## 2013-11-07 LAB — RAPID URINE DRUG SCREEN, HOSP PERFORMED
AMPHETAMINES: NOT DETECTED
BENZODIAZEPINES: NOT DETECTED
Barbiturates: NOT DETECTED
Cocaine: NOT DETECTED
OPIATES: NOT DETECTED
Tetrahydrocannabinol: NOT DETECTED

## 2013-11-07 LAB — CBC
HCT: 41 % (ref 36.0–46.0)
HEMOGLOBIN: 14 g/dL (ref 12.0–15.0)
MCH: 30.4 pg (ref 26.0–34.0)
MCHC: 34.1 g/dL (ref 30.0–36.0)
MCV: 88.9 fL (ref 78.0–100.0)
Platelets: 223 10*3/uL (ref 150–400)
RBC: 4.61 MIL/uL (ref 3.87–5.11)
RDW: 13.5 % (ref 11.5–15.5)
WBC: 6.1 10*3/uL (ref 4.0–10.5)

## 2013-11-07 LAB — GLUCOSE, CAPILLARY
GLUCOSE-CAPILLARY: 124 mg/dL — AB (ref 70–99)
Glucose-Capillary: 101 mg/dL — ABNORMAL HIGH (ref 70–99)

## 2013-11-07 LAB — TROPONIN I

## 2013-11-07 LAB — URINE MICROSCOPIC-ADD ON

## 2013-11-07 LAB — ETHANOL: Alcohol, Ethyl (B): <11 mg/dL (ref 0–11)

## 2013-11-07 LAB — POCT I-STAT TROPONIN I: Troponin i, poc: 0.01 ng/mL (ref 0.00–0.08)

## 2013-11-07 LAB — APTT: aPTT: 25 seconds (ref 24–37)

## 2013-11-07 MED ORDER — ASPIRIN 300 MG RE SUPP
300.0000 mg | Freq: Every day | RECTAL | Status: DC
  Filled 2013-11-07 (×4): qty 1

## 2013-11-07 MED ORDER — ASPIRIN 325 MG PO TABS
325.0000 mg | ORAL_TABLET | Freq: Every day | ORAL | Status: DC
  Administered 2013-11-07 – 2013-11-09 (×3): 325 mg via ORAL
  Filled 2013-11-07 (×3): qty 1

## 2013-11-07 MED ORDER — SENNOSIDES-DOCUSATE SODIUM 8.6-50 MG PO TABS: 1.0000 | ORAL_TABLET | Freq: Every evening | ORAL | Status: DC | PRN

## 2013-11-07 MED ORDER — ENOXAPARIN SODIUM 40 MG/0.4ML ~~LOC~~ SOLN
40.0000 mg | SUBCUTANEOUS | Status: DC
  Administered 2013-11-07 – 2013-11-08 (×2): 40 mg via SUBCUTANEOUS
  Filled 2013-11-07 (×4): qty 0.4

## 2013-11-07 MED ORDER — ATORVASTATIN CALCIUM 10 MG PO TABS
10.0000 mg | ORAL_TABLET | Freq: Every day | ORAL | Status: DC
  Filled 2013-11-07 (×2): qty 1

## 2013-11-07 MED ORDER — ROSUVASTATIN CALCIUM 5 MG PO TABS
5.0000 mg | ORAL_TABLET | Freq: Every day | ORAL | Status: DC
  Administered 2013-11-07 – 2013-11-08 (×2): 5 mg via ORAL
  Filled 2013-11-07 (×3): qty 1

## 2013-11-07 MED ORDER — ROSUVASTATIN CALCIUM 5 MG PO TABS: 5.0000 mg | ORAL_TABLET | Freq: Every day | ORAL | Status: DC

## 2013-11-07 MED ORDER — HYDRALAZINE HCL 20 MG/ML IJ SOLN: 10.0000 mg | Freq: Three times a day (TID) | INTRAMUSCULAR | Status: DC | PRN

## 2013-11-07 MED ORDER — SODIUM CHLORIDE 0.9 % IV SOLN
Freq: Once | INTRAVENOUS | Status: AC
  Administered 2013-11-07: 11:00:00 via INTRAVENOUS

## 2013-11-07 NOTE — ED Notes (Signed)
To ED via Penny Mathews -- transfer from [facility]. C/o right side of head and neck "feels funny" also face and right hand feels numb -- on arrival-- alert/oriented x 3, w/d, equal grips, no drift in arms, pt states got out of shower, and did not know how her clothes got in the bathroom.  Pt went directly from bridge to CT

## 2013-11-07 NOTE — Consult Note (Signed)
Neurology Consultation Reason for Consult: Rigth face and arm numbness Referring Physician: Rancour, S  CC: Right sided numbness  History is obtained from:patient  HPI: Penny Mathews is a 80 y.o. female who awoke in her normal state around 7:30 this morning. Around 8, after a shower she felt mildly confused, but also had sudden onset of right arm adn face numbness which has been persistent. The chart staets "headache" but the patient clearly staes that her right face neck and arm are feeling "weird" and she "does NOT have a headache."   The symptoms are persistent without improvement or worsening.   LKW: 8 am tpa given?: no, mild symptoms.   ROS: A 14 point ROS was performed and is negative except as noted in the HPI.  Past Medical History  Diagnosis Date  . Cancer     Breast Ca - chemo & radiation 1990    Family History: Brother - mild stroke  Social History: Tob: current smoker.   Exam: Current vital signs: BP 210/90  Pulse 80  Resp 25  SpO2 98% Vital signs in last 24 hours: Temp:  [98.3 F (36.8 C)] 98.3 F (36.8 C) (01/24 1049) Pulse Rate:  [80-91] 80 (01/24 1128) Resp:  [16-25] 25 (01/24 1128) BP: (176-210)/(90-92) 210/90 mmHg (01/24 1128) SpO2:  [98 %-100 %] 98 % (01/24 1128)  General: in bed, NAD CV: RRR Mental Status: Patient is awake, alert, oriented to person, place, month, year, and situation. Immediate and remote memory are intact. Patient is able to give a clear and coherent history. No signs of aphasia or neglect Cranial Nerves: II: Visual Fields are full. Pupils are equal, round, and reactive to light.  Discs are difficult to visualize. III,IV, VI: EOMI without ptosis or diploplia.  V: Facial sensation is decreased on the right.  VII: Facial movement is symmetric.  VIII: hearing is intact to voice X: Uvula elevates symmetrically XI: Shoulder shrug is symmetric. XII: tongue is midline without atrophy or fasciculations.  Motor: Tone is  normal. Bulk is normal. 5/5 strength was present in all four extremities.  Sensory: Sensation is diminished to pin on the right arm > leg.  Deep Tendon Reflexes: 2+ and symmetric in the biceps and patellae.  Plantars: Toes are downgoing bilaterally.  Cerebellar: FNF and HKS are intact bilaterally Gait: Not assessed due to acute nature of evaluation and multiple medical monitors in ED setting.   I have reviewed labs in epic and the results pertinent to this consultation are: BMP - unremarkable  I have reviewed the images obtained:CT head - negative   Impression: 80 yo F with sudden onset right arm and face numbness in the setting of severe hypertension. She was not taking asa regularly prior to admission. I suspect a small thalamic or internal capsule lacune.   Recommendations: 1. HgbA1c, fasting lipid panel 2. MRI, MRA  of the brain without contrast 3. Frequent neuro checks 4. Echocardiogram 5. Carotid dopplers 6. Prophylactic therapy-Antiplatelet med: Aspirin - dose 325mg  7. Risk factor modification 8. Telemetry monitoring 9. PT consult, OT consult, Speech consult    Roland Rack, MD [facility] [phone]  If 7pm- 7am, please page neurology on call at [phone].

## 2013-11-07 NOTE — ED Notes (Signed)
[facility] has been adv of "Code Stroke" put in place by Maudie Mercury, [place]

## 2013-11-07 NOTE — ED Notes (Signed)
[facility] notified of Code Stroke; ED Charge RN notified per T. Darcella Cheshire, Therapist, sports.

## 2013-11-07 NOTE — H&P (Signed)
[facility] History and Physical  Penny Mathews YNW:[id number] DOB: [birth year] DOA: 11/07/2013  Referring physician:ED Physician.  PCP: Maximino Greenland, MD   Chief Complaint: numbness and tingling right side of face and arm.   HPI: Penny Mathews is a 80 y.o. female with PMH significant for Hypertension, current smoker, history of breast cancer S/P chemo radiation 1990 who presents to the ED complaining of cramps, numbness and tingling of right side of her face, neck , shoulder and fingers that started today after 8 am. She also notice some confusion. She denies vision changes, focal weakness, no slurred speech. She denies chest pain, dyspnea, headache.   Patient was Code stroke. No TPA given due to mild symptoms. Neuro following.   Review of Systems:  Negative except as per HPI.   Past Medical History  Diagnosis Date  . Cancer     Breast Ca - chemo & radiation 1990  . Hypertension    Past Surgical History  Procedure Laterality Date  . Abdominal hysterectomy    . Cervical fusion    . Breast lumpectomy    . Fractured leg x 2     Social History:  reports that she has been smoking Cigarettes.  She has been smoking about 0.20 packs per day. She does not have any smokeless tobacco history on file. She reports that she does not drink alcohol or use illicit drugs.  No Known Allergies  Family History; brother with history stroke.   Prior to Admission medications   Medication Sig Start Date End Date Taking? Authorizing Provider  amLODipine (NORVASC) 5 MG tablet Take 5 mg by mouth daily.   Yes Historical Provider, MD  rosuvastatin (CRESTOR) 5 MG tablet Take 5 mg by mouth daily.   Yes Historical Provider, MD  triamcinolone (NASACORT AQ) 55 MCG/ACT nasal inhaler Place 2 sprays into the nose daily. 04/11/13  Yes Benjiman Core, PA-C   Physical Exam: Filed Vitals:   11/07/13 1245  BP:   Pulse: 74  Resp: 12    BP 194/85  Pulse 74  Resp 12  SpO2 100%  General:  Appears calm  and comfortable Eyes: PERRL, normal lids, irises & conjunctiva ENT: grossly normal hearing, lips & tongue Neck: no LAD, masses or thyromegaly Cardiovascular: RRR, no m/r/g. No LE edema. Telemetry: SR, no arrhythmias  Respiratory: CTA bilaterally, no w/r/r. Normal respiratory effort. Abdomen: soft, ntnd Skin: no rash or induration seen on limited exam Musculoskeletal: grossly normal tone BUE/BLE Psychiatric: grossly normal mood and affect, speech fluent and appropriate Neurologic: grossly non-focal.patient report right side of face numbness and tingling to her shoulder, and tingling right fingers.           Labs on Admission:  Basic Metabolic Panel:  Recent Labs Lab 11/07/13 1116 11/07/13 1127  NA 143 141  K 3.9 3.7  CL 102 102  CO2 27  --   GLUCOSE 117* 117*  BUN 9 8  CREATININE 0.88 1.00  CALCIUM 9.1  --    Liver Function Tests:  Recent Labs Lab 11/07/13 1116  AST 26  ALT 20  ALKPHOS 92  BILITOT 0.4  PROT 7.5  ALBUMIN 4.0   No results found for this basename: LIPASE, AMYLASE,  in the last 168 hours No results found for this basename: AMMONIA,  in the last 168 hours CBC:  Recent Labs Lab 11/07/13 1116 11/07/13 1127  WBC 6.1  --   NEUTROABS 3.8  --   HGB 14.0 15.0  HCT 41.0 44.0  MCV 88.9  --   PLT 223  --    Cardiac Enzymes:  Recent Labs Lab 11/07/13 1116  TROPONINI <0.30    BNP (last 3 results) No results found for this basename: PROBNP,  in the last 8760 hours CBG:  Recent Labs Lab 11/07/13 1059 11/07/13 1150  GLUCAP 124* 101*    Radiological Exams on Admission: Ct Head Wo Contrast  11/07/2013   CLINICAL DATA:  Confusion and right-sided headache that radiates down the right side of the face. Right facial numbness and tingling. Right hand numbness and tingling. Code stroke.  EXAM: CT HEAD WITHOUT CONTRAST  TECHNIQUE: Contiguous axial images were obtained from the base of the skull through the vertex without intravenous contrast.   COMPARISON:  No priors.  FINDINGS: Patchy areas of mild decreased attenuation in the periventricular white matter of the cerebral hemispheres bilaterally, most compatible with mild chronic microvascular ischemic disease. No acute intracranial abnormalities. Specifically, no evidence of acute intracranial hemorrhage, no definite findings of acute/subacute cerebral ischemia, no mass, mass effect, hydrocephalus or abnormal intra or extra-axial fluid collections. Visualized paranasal sinuses and mastoids are well pneumatized. No acute displaced skull fractures are identified.  IMPRESSION: 1. No acute intracranial abnormalities. 2. Mild chronic microvascular ischemic changes in the cerebral white matter.   Electronically Signed   By: Vinnie Langton M.D.   On: 11/07/2013 11:30    EKG: order.   Assessment/Plan Active Problems:   CVA (cerebral infarction)   Hypertension   Stroke  1- Right side face, and upper extremity numbness, tingling: likely acute stroke.  Admit to telemetry MRI, MRA, ECHO, Carotid doppler. Hb-A1c, lipid panel.  Continue with Lipitor. Start aspirin,. Patient was not taking a daily dose of aspirin.  PT, OT, speech evaluation. \ Permissive Hypertension in setting of likely stoke. Will order PRN Hydralazine for SBP more than 220, diastolic 254 - 270.  CT head negative.   2-HTN; Hold Norvasc. Permissive hypertension in setting of acute stroke. PRN hydralazine.   Code Status: Full Code.  Family Communication: Care discussed with patient.  Disposition Plan: expect 2 days depending on test result and medical condition.   Time spent: 75 minutes.   [facility] Pager [phone]

## 2013-11-07 NOTE — ED Notes (Signed)
Woke up this morning feeling fine; got out of shower and couldn't remember how her clothing got there or who made her bed.  Had sudden onset right sided HA starting at 0800 per pt.  Pt A&Ox3.  Bilat hand grasps = and strong.  Smile symmetrical.  Has not taken HTN med today.

## 2013-11-07 NOTE — ED Provider Notes (Signed)
CSN: [id number]     Arrival date & time 11/07/13  1112 History   First MD Initiated Contact with Patient 11/07/13 1114     Chief Complaint  Patient presents with  . Code Stroke   (Consider location/radiation/quality/duration/timing/severity/associated sxs/prior Treatment) HPI Comments: Patient presents from urgent care with sudden onset of numbness, tingling, funny feeling in the right side of her face that onset around 8:30 AM. It radiates to her right neck. She difficulty remembering how she got in the shower and difficulty with her right arm and taking her clothes off. Symptoms seem to have improved since onset. She denies any chest pain or shortness of breath. She denies any abdominal pain, nausea or vomiting. Denies any difficulty speaking or swallowing. She denies any weakness, numbness or tingling in her legs. Code stroke was activated at urgent care.  The history is provided by the patient.    Past Medical History  Diagnosis Date  . Cancer     Breast Ca - chemo & radiation 1990  . Hypertension    Past Surgical History  Procedure Laterality Date  . Abdominal hysterectomy    . Cervical fusion    . Breast lumpectomy    . Fractured leg x 2     History reviewed. No pertinent family history. History  Substance Use Topics  . Smoking status: Current Every Day Smoker -- 0.20 packs/day    Types: Cigarettes  . Smokeless tobacco: Not on file  . Alcohol Use: No   OB History   Grav Para Term Preterm Abortions TAB SAB Ect Mult Living                 Review of Systems  Constitutional: Negative for activity change and appetite change.  Respiratory: Negative for cough, chest tightness and shortness of breath.   Cardiovascular: Negative for chest pain.  Gastrointestinal: Negative for nausea, vomiting and abdominal pain.  Genitourinary: Negative for dysuria and hematuria.  Musculoskeletal: Negative for back pain.  Neurological: Positive for weakness, light-headedness, numbness and  headaches. Negative for facial asymmetry.  A complete 10 system review of systems was obtained and all systems are negative except as noted in the HPI and PMH.    Allergies  Review of patient's allergies indicates no known allergies.  Home Medications   No current outpatient prescriptions on file. BP 178/161  Pulse 83  Temp(Src) 98 F (36.7 C)  Resp 26  SpO2 95% Physical Exam  Constitutional: She is oriented to person, place, and time. She appears well-developed and well-nourished. No distress.  HENT:  Head: Normocephalic and atraumatic.  Mouth/Throat: Oropharynx is clear and moist. No oropharyngeal exudate.  Eyes: Conjunctivae and EOM are normal. Pupils are equal, round, and reactive to light.  Neck: Normal range of motion. Neck supple.  Cardiovascular: Normal rate, regular rhythm and normal heart sounds.   No murmur heard. Pulmonary/Chest: Effort normal and breath sounds normal. No respiratory distress.  Abdominal: Soft. There is no tenderness. There is no rebound and no guarding.  Musculoskeletal: Normal range of motion. She exhibits no edema and no tenderness.  Neurological: She is alert and oriented to person, place, and time. No cranial nerve deficit. She exhibits normal muscle tone. Coordination normal.  CN 2-12 intact, no ataxia on finger to nose, no nystagmus, 5/5 strength throughout, no pronator drift, Romberg negative, normal gait.  Reduced sensation on R face, neck, arm.  Skin: Skin is warm.    ED Course  Procedures (including critical care time) Labs Review Labs  Reviewed  COMPREHENSIVE METABOLIC PANEL - Abnormal; Notable for the following:    Glucose, Bld 117 (*)    GFR calc non Af Amer 62 (*)    GFR calc Af Amer 72 (*)    All other components within normal limits  URINALYSIS, ROUTINE W REFLEX MICROSCOPIC - Abnormal; Notable for the following:    Hgb urine dipstick TRACE (*)    Leukocytes, UA TRACE (*)    All other components within normal limits  GLUCOSE,  CAPILLARY - Abnormal; Notable for the following:    Glucose-Capillary 101 (*)    All other components within normal limits  POCT I-STAT, CHEM 8 - Abnormal; Notable for the following:    Glucose, Bld 117 (*)    All other components within normal limits  ETHANOL  PROTIME-INR  APTT  CBC  DIFFERENTIAL  TROPONIN I  URINE RAPID DRUG SCREEN (HOSP PERFORMED)  URINE MICROSCOPIC-ADD ON  POCT I-STAT TROPONIN I   Imaging Review Dg Chest 2 View  11/07/2013   CLINICAL DATA:  Facial numbness.  EXAM: CHEST  2 VIEW  COMPARISON:  Chest x-ray 04/11/2013.  FINDINGS: Mediastinum and hilar structures are normal. Heart size normal. Minimal basilar atelectasis. Lungs are otherwise clear. No pleural effusion or pneumothorax. Surgical clips left axilla. No focal acute bony abnormality. Degenerative changes thoracic spine. Prior cervical spine fusion.  IMPRESSION: Minimal atelectasis in the lung bases. No acute cardiopulmonary disease.   Electronically Signed   By: Marcello Moores  Register   On: 11/07/2013 14:19   Ct Head Wo Contrast  11/07/2013   CLINICAL DATA:  Confusion and right-sided headache that radiates down the right side of the face. Right facial numbness and tingling. Right hand numbness and tingling. Code stroke.  EXAM: CT HEAD WITHOUT CONTRAST  TECHNIQUE: Contiguous axial images were obtained from the base of the skull through the vertex without intravenous contrast.  COMPARISON:  No priors.  FINDINGS: Patchy areas of mild decreased attenuation in the periventricular white matter of the cerebral hemispheres bilaterally, most compatible with mild chronic microvascular ischemic disease. No acute intracranial abnormalities. Specifically, no evidence of acute intracranial hemorrhage, no definite findings of acute/subacute cerebral ischemia, no mass, mass effect, hydrocephalus or abnormal intra or extra-axial fluid collections. Visualized paranasal sinuses and mastoids are well pneumatized. No acute displaced skull  fractures are identified.  IMPRESSION: 1. No acute intracranial abnormalities. 2. Mild chronic microvascular ischemic changes in the cerebral white matter.   Electronically Signed   By: Vinnie Langton M.D.   On: 11/07/2013 11:30    EKG Interpretation    Date/Time:    Ventricular Rate:    PR Interval:    QRS Duration:   QT Interval:    QTC Calculation:   R Axis:     Text Interpretation:              MDM   1. CVA (cerebral infarction)   2. Hypertension    R sided facial and neck tingling and heaviness.  No motor weakness.  No difficulty speaking or swallowing. Code stroke on arrival.  CT head negative for hemorrhage Patient has been seen by stroke team and neurology. Concern for acute thalamic infarct. Not tPA candidate secondary to low NIH scale.  Will allow permissive hypertension in setting of CVA.   Date: 11/07/2013  Rate: 76  Rhythm: normal sinus rhythm  QRS Axis: normal  Intervals: normal  ST/T Wave abnormalities: normal  Conduction Disutrbances:none  Narrative Interpretation:   Old EKG Reviewed: none available  Ezequiel Essex, MD 11/07/13 [phone]

## 2013-11-07 NOTE — ED Notes (Signed)
Oxygen N/C in place at 2L/min

## 2013-11-07 NOTE — Code Documentation (Signed)
Code stroke called at 1054,  Patient was brought to [facility] ED via [facility] from urgent care.  As per patient, awoke at 0700 and took a shower.  After shower patient noticed that she was confused and developed pain on the right side of her head and face down to her neck.  She also states she had numbness of the right arm.  LSN 0800.  NIHSS 1

## 2013-11-07 NOTE — ED Provider Notes (Signed)
CSN: [id number]     Arrival date & time 11/07/13  1030 History   None    Chief Complaint  Patient presents with  . Code Stroke   (Consider location/radiation/quality/duration/timing/severity/associated sxs/prior Treatment) HPI Comments: 80 year old female presents complaining of memory loss. Starting this morning while she was in the shower, she had acute onset of right-sided headache, she states she feels like there is a muscle that starts in her right forehead tenderness around the right side of her head down to her neck. At that time when the headache started, she suddenly could not remember how she got in the shower. She states there were clothes on the floor and the bed was made but she does not know how that happened.  She is still having the strange feeling in her head at this time. She denies any other symptoms.  Patient is a 80 y.o. female presenting with Acute Neurological Problem.  Cerebrovascular Accident Associated symptoms include headaches. Pertinent negatives include no chest pain, no abdominal pain and no shortness of breath.    Past Medical History  Diagnosis Date  . Cancer     Breast Ca - chemo & radiation 1990   Past Surgical History  Procedure Laterality Date  . Abdominal hysterectomy    . Cervical fusion    . Breast lumpectomy    . Fractured leg x 2     No family history on file. History  Substance Use Topics  . Smoking status: Current Every Day Smoker    Types: Cigarettes  . Smokeless tobacco: Not on file  . Alcohol Use: No   OB History   Grav Para Term Preterm Abortions TAB SAB Ect Mult Living                 Review of Systems  Constitutional: Negative for fever and chills.  Eyes: Negative for visual disturbance.  Respiratory: Negative for cough and shortness of breath.   Cardiovascular: Negative for chest pain, palpitations and leg swelling.  Gastrointestinal: Negative for nausea, vomiting and abdominal pain.  Endocrine: Negative for polydipsia  and polyuria.  Genitourinary: Negative for dysuria, urgency and frequency.  Musculoskeletal: Negative for arthralgias and myalgias.  Skin: Negative for rash.  Neurological: Positive for headaches. Negative for dizziness, weakness and light-headedness.       Acute memory loss    Allergies  Review of patient's allergies indicates no known allergies.  Home Medications   Current Outpatient Rx  Name  Route  Sig  Dispense  Refill  . rosuvastatin (CRESTOR) 5 MG tablet   Oral   Take 5 mg by mouth daily.         Marland Kitchen UNKNOWN TO PATIENT      A HTN med         . chlorpheniramine-HYDROcodone (TUSSIONEX PENNKINETIC ER) 10-8 MG/5ML LQCR   Oral   Take 5 mLs by mouth every 12 (twelve) hours as needed.   140 mL   0     Only take this medication while at home.  Do not d ...   . fexofenadine-pseudoephedrine (ALLEGRA-D) 60-120 MG per tablet   Oral   Take 1 tablet by mouth every 12 (twelve) hours.   30 tablet   0   . triamcinolone (NASACORT AQ) 55 MCG/ACT nasal inhaler   Nasal   Place 2 sprays into the nose daily.   1 Inhaler   1     Both nostrils    BP 176/92  Pulse 91  Temp(Src) 98.3 F (  36.8 C) (Oral)  Resp 16  SpO2 100% Physical Exam  Nursing note and vitals reviewed. Constitutional: She is oriented to person, place, and time. Vital signs are normal. She appears well-developed and well-nourished. No distress.  HENT:  Head: Normocephalic and atraumatic.  Pulmonary/Chest: Effort normal. No respiratory distress.  Neurological: She is alert and oriented to person, place, and time. She has normal strength. No cranial nerve deficit or sensory deficit. Coordination normal. GCS eye subscore is 4. GCS verbal subscore is 5. GCS motor subscore is 6.  No pronator drift  Skin: Skin is warm and dry. No rash noted. She is not diaphoretic.  Psychiatric: She has a normal mood and affect. Judgment normal.    ED Course  Procedures (including critical care time) Labs Review Labs  Reviewed - No data to display Imaging Review No results found.    MDM   1. Memory loss   2. Unilateral headache    CVA versus TIA versus complex migraine versus transient global amnesia. Transferred to the ED for further evaluation    Liam Graham, PA-C 11/07/13 [place] Lenell Mcconnell, PA-C 11/07/13 1106

## 2013-11-07 NOTE — ED Provider Notes (Signed)
Medical screening examination/treatment/procedure(s) were performed by non-physician practitioner and as supervising physician I was immediately available for consultation/collaboration.  Philipp Deputy, M.D.   Harden Mo, MD 11/07/13 2138

## 2013-11-08 ENCOUNTER — Inpatient Hospital Stay (HOSPITAL_COMMUNITY): Payer: Medicare HMO

## 2013-11-08 DIAGNOSIS — G459 Transient cerebral ischemic attack, unspecified: Principal | ICD-10-CM

## 2013-11-08 DIAGNOSIS — I369 Nonrheumatic tricuspid valve disorder, unspecified: Secondary | ICD-10-CM

## 2013-11-08 DIAGNOSIS — M542 Cervicalgia: Secondary | ICD-10-CM

## 2013-11-08 LAB — LIPID PANEL
CHOL/HDL RATIO: 2.5 ratio
Cholesterol: 208 mg/dL — ABNORMAL HIGH (ref 0–200)
HDL: 82 mg/dL (ref >39)
LDL CALC: 103 mg/dL — AB (ref 0–99)
TRIGLYCERIDES: 116 mg/dL (ref <150)
VLDL: 23 mg/dL (ref 0–40)

## 2013-11-08 LAB — HEMOGLOBIN A1C
HEMOGLOBIN A1C: 6.4 % — AB (ref <5.7)
Mean Plasma Glucose: 137 mg/dL — ABNORMAL HIGH (ref <117)

## 2013-11-08 MED ORDER — TRAMADOL HCL 50 MG PO TABS: 50.0000 mg | ORAL_TABLET | Freq: Four times a day (QID) | ORAL | Status: DC | PRN

## 2013-11-08 MED ORDER — AMLODIPINE BESYLATE 5 MG PO TABS
5.0000 mg | ORAL_TABLET | Freq: Every day | ORAL | Status: DC
  Administered 2013-11-08: 5 mg via ORAL
  Filled 2013-11-08 (×2): qty 1

## 2013-11-08 NOTE — Evaluation (Signed)
Occupational Therapy Evaluation Patient Details Name: Penny Mathews MRN: [id number] DOB: 03-Mar-1934 Today's Date: 11/08/2013 Time: 3220-2542 OT Time Calculation (min): 44 min  OT Assessment / Plan / Recommendation History of present illness  Penny Mathews is a 80 y.o. female with PMH significant for Hypertension, current smoker, history of breast cancer S/P chemo radiation 1990 who presents to the ED complaining of cramps, numbness and tingling of right side of her face, neck , shoulder and fingers.  Patient currently c/o pain in right side of neck.  No issues in face or RUE.   Clinical Impression   Pt admitted with above.  She demonstrates the below listed deficits.  She is performing all BADLs independently, but with pain 7/10 Rt. Shoulder and neck.  She reports that she feels like she pulled a muscle and can't get comfortable.  Myofascial release and gentle soft tissue mobs performed with a reduction in pain to 2/10.   Instructed pt in Rt. UE AROM and neck ROM.  Will follow up tomorrow    OT Assessment  Patient needs continued OT Services    Follow Up Recommendations  No OT follow up    Barriers to Discharge      Equipment Recommendations  None recommended by OT    Recommendations for Other Services    Frequency  Min 2X/week    Precautions / Restrictions Precautions Precautions: None Restrictions Weight Bearing Restrictions: No   Pertinent Vitals/Pain     ADL  Eating/Feeding: Independent Where Assessed - Eating/Feeding: Bed level Grooming: Wash/dry hands;Wash/dry face;Teeth care;Brushing hair;Independent Where Assessed - Grooming: Unsupported standing Upper Body Bathing: Set up Where Assessed - Upper Body Bathing: Unsupported sitting Lower Body Bathing: Set up Where Assessed - Lower Body Bathing: Unsupported sit to stand Upper Body Dressing: Set up Where Assessed - Upper Body Dressing: Unsupported sitting Lower Body Dressing: Set up Where Assessed - Lower Body  Dressing: Unsupported sit to stand Toilet Transfer: Modified independent Toilet Transfer Method: Sit to Loss adjuster, chartered: Comfort height toilet Toileting - Clothing Manipulation and Hygiene: Modified independent Where Assessed - Toileting Clothing Manipulation and Hygiene: Standing ADL Comments: Pt reports she has been performing BADLs independently after set up.  Pt complaint of Rt. neck and shoulder pain - "feels like I pulled a muscle real bad".  Myofascial release and gentle soft tissue mobilization performed traps, levator, anterior chest and shoulder.  Pt with pain 7/10 and limited rotation neck;  Pain at end of session 2/10 with 75% neck rotation actively.  No pain with shoulder ROM.  Instructed pt to performed neck ROM exercises and shoulder flexion abduction     OT Diagnosis: Acute pain  OT Problem List: Pain OT Treatment Interventions: Therapeutic exercise;Manual therapy;Patient/family education   OT Goals(Current goals can be found in the care plan section) Acute Rehab OT Goals Patient Stated Goal: to get rid of the pain  OT Goal Formulation: With patient Time For Goal Achievement: 11/11/13 Potential to Achieve Goals: Good ADL Goals Pt/caregiver will Perform Home Exercise Program: Increased ROM;Right Upper extremity;Independently Additional ADL Goal #1: Pt will complete all BADLs with UE pain no greater than 2/10  Visit Information  Last OT Received On: 11/08/13 Assistance Needed: +1 History of Present Illness:  Penny Mathews is a 80 y.o. female with PMH significant for Hypertension, current smoker, history of breast cancer S/P chemo radiation 1990 who presents to the ED complaining of cramps, numbness and tingling of right side of her face, neck , shoulder  and fingers.  Patient currently c/o pain in right side of neck.  No issues in face or RUE.       [place] expects to be discharged to:: Private residence Living  Arrangements: Alone Available Help at Discharge: Family;Friend(s);Available PRN/intermittently Type of Home: House Home Access: Stairs to enter [employer] of Steps: 2 Entrance Stairs-Rails: None Home Layout: One level Home Equipment: Walker - 2 wheels;Cane - single point Prior Function Level of Independence: Independent Communication Communication: No difficulties Dominant Hand: Right         Vision/Perception Vision - History Baseline Vision: Wears glasses all the time Patient Visual Report: No change from baseline Vision - Assessment Vision Assessment: Vision not tested   Cognition  Cognition Arousal/Alertness: Awake/alert Behavior During Therapy: WFL for tasks assessed/performed Overall Cognitive Status: Within Functional Limits for tasks assessed    Extremity/Trunk Assessment Upper Extremity Assessment Upper Extremity Assessment: Overall WFL for tasks assessed (however pain Rt. shoulder ) Lower Extremity Assessment Lower Extremity Assessment: Defer to PT evaluation Cervical / Trunk Assessment Cervical / Trunk Assessment: Other exceptions Cervical / Trunk Exceptions: Decreased neck rotation.  Pain/"tightness" Rt side of neck.     Mobility       Exercise     Balance     End of Session OT - End of Session Activity Tolerance: Patient tolerated treatment well Patient left: in bed;with call bell/phone within reach;with family/visitor present Nurse Communication: Other (comment) (change in pain)  GO     Omaree Fuqua, Ellard Artis M 11/08/2013, 4:36 PM

## 2013-11-08 NOTE — Progress Notes (Signed)
[facility] PROGRESS NOTE  Penny Mathews PYP:[id number] DOB: Feb 07, 1934 DOA: 11/07/2013 PCP: Maximino Greenland, MD  Assessment/Plan: Right side face, and upper extremity numbness, tingling: likely acute stroke.  telemetry  MRI, MRA: negative ECHO, Carotid doppler pending Hb-A1c pending, lipid panel LDL 103 Continue with Lipitor.  -resume ASA  PT, OT, speech evaluation.  CT head negative.   HTN: resume Norvasc. Permissive hypertension in setting of acute stroke. PRN hydralazine.    Neck pain -h/o of fusion -heat -PRn pain meds   Code Status: full Family Communication: pateint Disposition Plan: home once work up done   Consultants:  neuro  Procedures:   Antibiotics:    HPI/Subjective: C/o right sided neck pain  Objective: Filed Vitals:   11/08/13 0500  BP: 141/69  Pulse: 83  Temp: 97.6 F (36.4 C)  Resp: 18   No intake or output data in the 24 hours ending 11/08/13 0924 Filed Weights   11/07/13 1516  Weight: 79.788 kg (175 lb 14.4 oz)    Exam:   General:  A+Ox3, NAD  Cardiovascular: rrr  Respiratory: clear anterior  Abdomen: +BS, soft  Musculoskeletal: moves all 4 ext   Data Reviewed: Basic Metabolic Panel:  Recent Labs Lab 11/07/13 1116 11/07/13 1127  NA 143 141  K 3.9 3.7  CL 102 102  CO2 27  --   GLUCOSE 117* 117*  BUN 9 8  CREATININE 0.88 1.00  CALCIUM 9.1  --    Liver Function Tests:  Recent Labs Lab 11/07/13 1116  AST 26  ALT 20  ALKPHOS 92  BILITOT 0.4  PROT 7.5  ALBUMIN 4.0   No results found for this basename: LIPASE, AMYLASE,  in the last 168 hours No results found for this basename: AMMONIA,  in the last 168 hours CBC:  Recent Labs Lab 11/07/13 1116 11/07/13 1127  WBC 6.1  --   NEUTROABS 3.8  --   HGB 14.0 15.0  HCT 41.0 44.0  MCV 88.9  --   PLT 223  --    Cardiac Enzymes:  Recent Labs Lab 11/07/13 1116  TROPONINI <0.30   BNP (last 3 results) No results found for this basename:  PROBNP,  in the last 8760 hours CBG:  Recent Labs Lab 11/07/13 1059 11/07/13 1150  GLUCAP 124* 101*    No results found for this or any previous visit (from the past 240 hour(s)).   Studies: Dg Chest 2 View  11/07/2013   CLINICAL DATA:  Facial numbness.  EXAM: CHEST  2 VIEW  COMPARISON:  Chest x-ray 04/11/2013.  FINDINGS: Mediastinum and hilar structures are normal. Heart size normal. Minimal basilar atelectasis. Lungs are otherwise clear. No pleural effusion or pneumothorax. Surgical clips left axilla. No focal acute bony abnormality. Degenerative changes thoracic spine. Prior cervical spine fusion.  IMPRESSION: Minimal atelectasis in the lung bases. No acute cardiopulmonary disease.   Electronically Signed   By: Marcello Moores  Register   On: 11/07/2013 14:19   Ct Head Wo Contrast  11/07/2013   CLINICAL DATA:  Confusion and right-sided headache that radiates down the right side of the face. Right facial numbness and tingling. Right hand numbness and tingling. Code stroke.  EXAM: CT HEAD WITHOUT CONTRAST  TECHNIQUE: Contiguous axial images were obtained from the base of the skull through the vertex without intravenous contrast.  COMPARISON:  No priors.  FINDINGS: Patchy areas of mild decreased attenuation in the periventricular white matter of the cerebral hemispheres bilaterally, most compatible with mild chronic microvascular ischemic  disease. No acute intracranial abnormalities. Specifically, no evidence of acute intracranial hemorrhage, no definite findings of acute/subacute cerebral ischemia, no mass, mass effect, hydrocephalus or abnormal intra or extra-axial fluid collections. Visualized paranasal sinuses and mastoids are well pneumatized. No acute displaced skull fractures are identified.  IMPRESSION: 1. No acute intracranial abnormalities. 2. Mild chronic microvascular ischemic changes in the cerebral white matter.   Electronically Signed   By: Vinnie Langton M.D.   On: 11/07/2013 11:30   Mr  Brain Wo Contrast  11/07/2013   CLINICAL DATA:  Numbness and tingling right-sided face and arm. Hypertensive smoker with history of breast cancer post chemotherapy radiation therapy 1990.  EXAM: MRI HEAD WITHOUT CONTRAST  MRA HEAD WITHOUT CONTRAST  TECHNIQUE: Multiplanar, multiecho pulse sequences of the brain and surrounding structures were obtained without intravenous contrast. Angiographic images of the head were obtained using MRA technique without contrast.  COMPARISON:  11/07/2013 CT.  FINDINGS: MRI HEAD FINDINGS  No acute infarct. Small regions of artifact on diffusion sequence incidentally noted.  No intracranial hemorrhage.  Several small punctate and patchy matter type changes most consistent with result of small vessel disease.  No intracranial mass lesion noted on this unenhanced exam.  No hydrocephalus.  Abnormal appearance left vertebral artery.  Please see below.  Minimal paranasal sinus mucosal thickening.  Transverse ligament hypertrophy with mild encroachment upon the ventral aspect of the cervical medullary junction. Postsurgical changes upper cervical spine.  Partially empty sella probably an incidental finding.  Minimal exophthalmos.  Decrease pre pontine space but without other findings of benign intracranial hypotension.  MRA HEAD FINDINGS  Loss of signal distal vertebral artery level new. Despite this artifact, left vertebral artery appears occluded. Difficult to adequately assess the right vertebral artery which appears grossly patent.  Ectatic basilar artery with mild to slightly moderate narrowing proximal basilar artery. Artifact may contribute to this appearance.  Poor delineation of the posterior inferior cerebellar artery and anterior inferior cerebellar artery bilaterally.  Bulbous appearance of the basilar tip without aneurysm.  Fetal type contribution to the posterior cerebral arteries.  Ectatic cavernous segment internal carotid artery with irregularity more notable on the left.   Decrease number of visualized middle cerebral artery branches. Branches which are visualized appear slightly narrowed and irregular.  Narrowing and irregularity of the A2 segment of the left anterior cerebral artery.  IMPRESSION: MR brain:  No acute infarct.  Please see above.  MR angiogram:  Left vertebral artery is not visualized and may be occluded.  Intracranial atherosclerotic type changes otherwise as detailed above.   Electronically Signed   By: Chauncey Cruel M.D.   On: 11/07/2013 17:52   Mr Jodene Nam Head/brain Wo Cm  11/07/2013   CLINICAL DATA:  Numbness and tingling right-sided face and arm. Hypertensive smoker with history of breast cancer post chemotherapy radiation therapy 1990.  EXAM: MRI HEAD WITHOUT CONTRAST  MRA HEAD WITHOUT CONTRAST  TECHNIQUE: Multiplanar, multiecho pulse sequences of the brain and surrounding structures were obtained without intravenous contrast. Angiographic images of the head were obtained using MRA technique without contrast.  COMPARISON:  11/07/2013 CT.  FINDINGS: MRI HEAD FINDINGS  No acute infarct. Small regions of artifact on diffusion sequence incidentally noted.  No intracranial hemorrhage.  Several small punctate and patchy matter type changes most consistent with result of small vessel disease.  No intracranial mass lesion noted on this unenhanced exam.  No hydrocephalus.  Abnormal appearance left vertebral artery.  Please see below.  Minimal paranasal sinus mucosal thickening.  Transverse ligament hypertrophy with mild encroachment upon the ventral aspect of the cervical medullary junction. Postsurgical changes upper cervical spine.  Partially empty sella probably an incidental finding.  Minimal exophthalmos.  Decrease pre pontine space but without other findings of benign intracranial hypotension.  MRA HEAD FINDINGS  Loss of signal distal vertebral artery level new. Despite this artifact, left vertebral artery appears occluded. Difficult to adequately assess the right  vertebral artery which appears grossly patent.  Ectatic basilar artery with mild to slightly moderate narrowing proximal basilar artery. Artifact may contribute to this appearance.  Poor delineation of the posterior inferior cerebellar artery and anterior inferior cerebellar artery bilaterally.  Bulbous appearance of the basilar tip without aneurysm.  Fetal type contribution to the posterior cerebral arteries.  Ectatic cavernous segment internal carotid artery with irregularity more notable on the left.  Decrease number of visualized middle cerebral artery branches. Branches which are visualized appear slightly narrowed and irregular.  Narrowing and irregularity of the A2 segment of the left anterior cerebral artery.  IMPRESSION: MR brain:  No acute infarct.  Please see above.  MR angiogram:  Left vertebral artery is not visualized and may be occluded.  Intracranial atherosclerotic type changes otherwise as detailed above.   Electronically Signed   By: Chauncey Cruel M.D.   On: 11/07/2013 17:52    Scheduled Meds: . aspirin  300 mg Rectal Daily   Or  . aspirin  325 mg Oral Daily  . enoxaparin (LOVENOX) injection  40 mg Subcutaneous Q24H  . rosuvastatin  5 mg Oral q1800   Continuous Infusions:   Active Problems:   CVA (cerebral infarction)   Hypertension   Stroke    Time spent: 35 min    [facility], Azani Brogdon  [facility] Pager [phone]. If 7PM-7AM, please contact night-coverage at [email or web address], password [facility] 11/08/2013, 9:24 AM  LOS: 1 day

## 2013-11-08 NOTE — Progress Notes (Signed)
VASCULAR LAB PRELIMINARY  PRELIMINARY  PRELIMINARY  PRELIMINARY  Carotid duplex  completed.    Preliminary report:  Bilateral:  1-39% ICA stenosis.  Vertebral artery flow is antegrade.  Left vertebral artery flow is diminished with loss of diastolic component.    Ruta Capece, RVT 11/08/2013, 11:15 AM

## 2013-11-08 NOTE — Evaluation (Signed)
Physical Therapy Evaluation Patient Details Name: Penny Mathews MRN: [id number] DOB: 1934-04-29 Today's Date: 11/08/2013 Time: 4098-1191 PT Time Calculation (min): 17 min  PT Assessment / Plan / Recommendation History of Present Illness   Penny Mathews is a 80 y.o. female with PMH significant for Hypertension, current smoker, history of breast cancer S/P chemo radiation 1990 who presents to the ED complaining of cramps, numbness and tingling of right side of her face, neck , shoulder and fingers.  Patient currently c/o pain in right side of neck.  No issues in face or RUE.  Clinical Impression  Patient is independent with all mobility and gait.  Scored 24/24 on DGI balance assessment - no fall risk.  No acute PT needs identified - PT will sign off.  Symptom present is rt-sided neck pain.  May need f/u OP PT at a later date depending on work-up outcome.    PT Assessment  Patent does not need any further PT services    Follow Up Recommendations  No PT follow up;Supervision - Intermittent    Does the patient have the potential to tolerate intense rehabilitation      Barriers to Discharge        Equipment Recommendations  None recommended by PT    Recommendations for Other Services     Frequency      Precautions / Restrictions Precautions Precautions: None Restrictions Weight Bearing Restrictions: No   Pertinent Vitals/Pain Pain 5/10 neck.      Mobility  Bed Mobility Overal bed mobility: Independent Transfers Overall transfer level: Independent Equipment used: None Ambulation/Gait Ambulation/Gait assistance: Independent Ambulation Distance (Feet): 300 Feet Assistive device: None Gait Pattern/deviations: WFL(Within Functional Limits) Gait velocity interpretation: at or above normal speed for age/gender General Gait Details: Patient with good gait pattern, balance, and speed. Stairs: Yes Stairs assistance: Independent Stair Management: No rails;Alternating  pattern;Forwards Number of Stairs: 3 Modified Rankin (Stroke Patients Only) Pre-Morbid Rankin Score: No symptoms Modified Rankin: No significant disability        PT Goals(Current goals can be found in the care plan section)    Visit Information  Last PT Received On: 11/08/13 Assistance Needed: +1 History of Present Illness:  Penny Mathews is a 80 y.o. female with PMH significant for Hypertension, current smoker, history of breast cancer S/P chemo radiation 1990 who presents to the ED complaining of cramps, numbness and tingling of right side of her face, neck , shoulder and fingers.  Patient currently c/o pain in right side of neck.  No issues in face or RUE.       [place] expects to be discharged to:: Private residence Living Arrangements: Alone Available Help at Discharge: Family;Friend(s);Available PRN/intermittently Type of Home: House Home Access: Stairs to enter [employer] of Steps: 2 Entrance Stairs-Rails: None Home Layout: One level Home Equipment: Walker - 2 wheels;Cane - single point Prior Function Level of Independence: Independent Communication Communication: No difficulties    Cognition  Cognition Arousal/Alertness: Awake/alert Behavior During Therapy: WFL for tasks assessed/performed Overall Cognitive Status: Within Functional Limits for tasks assessed    Extremity/Trunk Assessment Upper Extremity Assessment Upper Extremity Assessment: Overall WFL for tasks assessed Lower Extremity Assessment Lower Extremity Assessment: Overall WFL for tasks assessed (Symmetrical strength) Cervical / Trunk Assessment Cervical / Trunk Assessment: Other exceptions Cervical / Trunk Exceptions: Decreased neck rotation.  Pain/"tightness" Rt side of neck.   Balance Balance Overall balance assessment: Independent Standardized Balance Assessment Standardized Balance Assessment : Dynamic Gait  Index Dynamic Gait Index Level  Surface: Normal Change in Gait Speed: Normal Gait with Horizontal Head Turns: Normal Gait with Vertical Head Turns: Normal Gait and Pivot Turn: Normal Step Over Obstacle: Normal Step Around Obstacles: Normal Steps: Normal Total Score: 24  End of Session PT - End of Session Equipment Utilized During Treatment: Gait belt Activity Tolerance: Patient tolerated treatment well Patient left: in bed;with call bell/phone within reach Nurse Communication: Mobility status (Neck pain)  GP     Despina Pole 11/08/2013, 9:13 AM Carita Pian. Sanjuana Kava, [place] Pager [phone]

## 2013-11-08 NOTE — Progress Notes (Signed)
  Echocardiogram 2D Echocardiogram has been performed.  Kylian Loh FRANCES 11/08/2013, 11:37 AM

## 2013-11-08 NOTE — Progress Notes (Signed)
Stroke Team Progress Note  HISTORY Penny Mathews is a 80 y.o. female who awoke in her normal state around 7:30 on the morning of 11/07/2013. Around 8, after a shower she felt mildly confused, but also had sudden onset of right arm and face numbness which had been persistent. The chart stated "headache" but the patient clearly stated that her right face neck and arm were feeling "weird" and she "did NOT have a headache."  The symptoms were persistent without improvement or worsening.   LKW: 8 am  tpa given?: no, mild symptoms.   SUBJECTIVE Pt's son and sister present. Pt feeling better. Sxs resolved. History of c-spine surgery. Has had similar sxs before.  She reports a pulling like sensation involving the left shoulder region on the right side, left facial region and left neck region. She reports having similar symptoms before she had her surgeries for cervical disc disease. The patient er reports being somewhat a little confused/disoriented during the initial part of the spell on yesterday.  OBJECTIVE Most recent Vital Signs: Filed Vitals:   11/07/13 2245 11/08/13 0100 11/08/13 0300 11/08/13 0500  BP: 132/67 143/67 138/65 141/69  Pulse: 102 51 56 83  Temp: 97.7 F (36.5 C) 98 F (36.7 C) 97.8 F (36.6 C) 97.6 F (36.4 C)  TempSrc: Oral Oral Oral Oral  Resp: 18 18 18 18   Height:      Weight:      SpO2: 97% 100% 100% 99%   CBG (last 3)   Recent Labs  11/07/13 1059 11/07/13 1150  GLUCAP 124* 101*    IV Fluid Intake:     MEDICATIONS  . amLODipine  5 mg Oral Daily  . aspirin  300 mg Rectal Daily   Or  . aspirin  325 mg Oral Daily  . enoxaparin (LOVENOX) injection  40 mg Subcutaneous Q24H  . rosuvastatin  5 mg Oral q1800   PRN:  hydrALAZINE, senna-docusate, traMADol  Diet:  Cardiac thin liquids Activity:  Up with assistance DVT Prophylaxis:  Lovenox  CLINICALLY SIGNIFICANT STUDIES Basic Metabolic Panel:   Recent Labs Lab 11/07/13 1116 11/07/13 1127  NA 143 141   K 3.9 3.7  CL 102 102  CO2 27  --   GLUCOSE 117* 117*  BUN 9 8  CREATININE 0.88 1.00  CALCIUM 9.1  --    Liver Function Tests:   Recent Labs Lab 11/07/13 1116  AST 26  ALT 20  ALKPHOS 92  BILITOT 0.4  PROT 7.5  ALBUMIN 4.0   CBC:   Recent Labs Lab 11/07/13 1116 11/07/13 1127  WBC 6.1  --   NEUTROABS 3.8  --   HGB 14.0 15.0  HCT 41.0 44.0  MCV 88.9  --   PLT 223  --    Coagulation:   Recent Labs Lab 11/07/13 1116  LABPROT 12.7  INR 0.97   Cardiac Enzymes:   Recent Labs Lab 11/07/13 1116  TROPONINI <0.30   Urinalysis:   Recent Labs Lab 11/07/13 1140  COLORURINE YELLOW  LABSPEC 1.007  PHURINE 7.5  GLUCOSEU NEGATIVE  HGBUR TRACE*  BILIRUBINUR NEGATIVE  KETONESUR NEGATIVE  PROTEINUR NEGATIVE  UROBILINOGEN 0.2  NITRITE NEGATIVE  LEUKOCYTESUR TRACE*   Lipid Panel    Component Value Date/Time   CHOL 208* 11/08/2013 0510   TRIG 116 11/08/2013 0510   HDL 82 11/08/2013 0510   CHOLHDL 2.5 11/08/2013 0510   VLDL 23 11/08/2013 0510   LDLCALC 103* 11/08/2013 0510   HgbA1C  No results  found for this basename: HGBA1C    Urine Drug Screen:     Component Value Date/Time   LABOPIA NONE DETECTED 11/07/2013 1140   COCAINSCRNUR NONE DETECTED 11/07/2013 1140   LABBENZ NONE DETECTED 11/07/2013 1140   AMPHETMU NONE DETECTED 11/07/2013 1140   THCU NONE DETECTED 11/07/2013 1140   LABBARB NONE DETECTED 11/07/2013 1140    Alcohol Level:   Recent Labs Lab 11/07/13 1116  ETH <11    Dg Chest 2 View 11/07/2013    Minimal atelectasis in the lung bases. No acute cardiopulmonary disease.     Ct Head Wo Contrast 11/07/2013    1. No acute intracranial abnormalities. 2. Mild chronic microvascular ischemic changes in the cerebral white matter.     Mr Brain Wo Contrast 11/07/2013    No acute infarct. Small regions of artifact on diffusion sequence incidentally noted.  No intracranial hemorrhage.  Several small punctate and patchy matter type changes most consistent  with result of small vessel disease.  No intracranial mass lesion noted on this unenhanced exam.  No hydrocephalus. Partially empty sella probably an incidental finding.   MR angiogram 11/07/2013 Left vertebral artery is not visualized and may be occluded.  Intracranial atherosclerotic type changes otherwise as detailed above.     2D Echocardiogram  ejection fraction 55-60%. No cardiac source of emboli identified.  Carotid Doppler  Preliminary report: Bilateral: 1-39% ICA stenosis. Vertebral artery flow is antegrade. Left vertebral artery flow is diminished with loss of diastolic component.   EKG  pending  Therapy Recommendations no physical therapy followup recommended - occupational therapist's evaluation pending  Physical Exam    Increased tone is noted of the trapezius and sternocliedomastiod muscle on the right side. Surgical scars are noted of the anterior neck regions bilaterally. Mental Status:  Patient is awake, alert, oriented to person, place, month, year, and situation.  Immediate and remote memory are intact.  Patient is able to give a clear and coherent history.  No signs of aphasia or neglect  Cranial Nerves:  II: Visual Fields are full. Pupils are equal, round, and reactive to light. Discs are difficult to visualize.  III,IV, VI: EOMI without ptosis or diploplia.  V: Facial sensation is decreased on the right.  VII: Facial movement is symmetric.  VIII: hearing is intact to voice  X: Uvula elevates symmetrically  XI: Shoulder shrug is symmetric.  XII: tongue is midline without atrophy or fasciculations.  Motor:  Tone is normal. Bulk is normal. 5/5 strength was present in all four extremities.  Sensory:  Sensation is diminished to pin on the right arm > leg.  Deep Tendon Reflexes:  2+ and symmetric in the biceps and patellae.  Plantars:  Toes are downgoing bilaterally.  Cerebellar:  FNF and HKS are intact bilaterally  Gait:  Not assessed due to acute nature of  evaluation and multiple medical monitors in ED setting.   ASSESSMENT [name] is a 80 y.o. female presenting with right upper extremity and right facial numbness.TPA was not given as the symptoms were mild . MRI negative for acute infarct. On No antithrombotics prior to admission. Now on aspirin 325 mg orally every day. Patient with resultant resolution of deficits. Work up underway.   Tobacco history  Hypertension history  Hospital day # 1  TREATMENT/PLAN  Continue aspirin 325 mg orally every day. However, consider discontinuation since the patient did not have a ischemic event.  Check EEG  Risk factor modification - smoking cessation.  EKG pending  No followup physical therapy recommended.  MRI of neck.  Mikey Bussing PA-C [facility] Pager [phone] 11/08/2013, 9:29 AM  I have personally obtained a history, examined the patient, evaluated imaging results, and formulated the assessment and plan of care. I agree with the above.

## 2013-11-09 ENCOUNTER — Ambulatory Visit (HOSPITAL_COMMUNITY): Payer: Medicare HMO

## 2013-11-09 MED ORDER — ASPIRIN 81 MG PO TABS: 81.0000 mg | ORAL_TABLET | Freq: Every day | ORAL | Status: AC

## 2013-11-09 MED ORDER — AMLODIPINE BESYLATE 10 MG PO TABS: 10.0000 mg | ORAL_TABLET | Freq: Every day | ORAL | Status: DC

## 2013-11-09 MED ORDER — POLYETHYLENE GLYCOL 3350 17 G PO PACK
17.0000 g | PACK | Freq: Every day | ORAL | Status: DC
  Administered 2013-11-09: 17 g via ORAL
  Filled 2013-11-09: qty 1

## 2013-11-09 MED ORDER — TRAMADOL HCL 50 MG PO TABS: 50.0000 mg | ORAL_TABLET | Freq: Four times a day (QID) | ORAL | Status: DC | PRN

## 2013-11-09 MED ORDER — AMLODIPINE BESYLATE 10 MG PO TABS
10.0000 mg | ORAL_TABLET | Freq: Every day | ORAL | Status: DC
  Administered 2013-11-09: 10 mg via ORAL
  Filled 2013-11-09: qty 1

## 2013-11-09 NOTE — Discharge Summary (Signed)
Physician Discharge Summary  Penny Mathews:[id number] DOB: 07-26-1934 DOA: 11/07/2013  PCP: Maximino Greenland, MD  Admit date: 11/07/2013 Discharge date: 11/09/2013  Time spent: 35 minutes  Recommendations for Outpatient Follow-up:  1. Stop smoking  Discharge Diagnoses:  Active Problems:  TIA   Hypertension  tobacco abuse   Discharge Condition: improved  Diet recommendation: cardiac  Filed Weights   11/07/13 1516  Weight: 79.788 kg (175 lb 14.4 oz)    History of present illness:  Penny Mathews is a 80 y.o. female with PMH significant for Hypertension, current smoker, history of breast cancer S/P chemo radiation 1990 who presents to the ED complaining of cramps, numbness and tingling of right side of her face, neck , shoulder and fingers that started today after 8 am. She also notice some confusion. She denies vision changes, focal weakness, no slurred speech. She denies chest pain, dyspnea, headache.  Patient was Code stroke. No TPA given due to mild symptoms. Neuro following.   Hospital Course:  Right side face, and upper extremity numbness, tingling: proved not to be a stroke.  telemetry  MRI, MRA: negative  ECHO, Carotid doppler ok Hb-A1c pending, lipid panel LDL 103  Continue with Lipitor.  -ASA 81 mg PT, OT, speech evaluation.- no further needs CT head negative.   HTN: resume Norvasc. Permissive hypertension in setting of acute stroke. PRN hydralazine   Procedures: Echo: Impressions:  - Normal LV wall thickness with LVEF 95-62%, grade 1 diastolic dysfunction. Mild left atrial enlargement. Mild tricuspid regurgitation with PASP 27 mmHg. No obvious PFO or ASD based on limited views.   Consultations:  neuro  Discharge Exam: Filed Vitals:   11/09/13 1022  BP: 170/83  Pulse: 65  Temp: 97.5 F (36.4 C)  Resp: 18    General: A+Ox3, NAD- walking around unit Cardiovascular: rrr Respiratory: clear anterior  Discharge Instructions       Discharge Orders   Future Orders Complete By Expires   Diet - low sodium heart healthy  As directed    Discharge instructions  As directed    Comments:     Stop smoking   Increase activity slowly  As directed        Medication List         amLODipine 10 MG tablet  Commonly known as:  NORVASC  Take 1 tablet (10 mg total) by mouth daily.     aspirin 81 MG tablet  Take 1 tablet (81 mg total) by mouth daily.     rosuvastatin 5 MG tablet  Commonly known as:  CRESTOR  Take 5 mg by mouth daily.     traMADol 50 MG tablet  Commonly known as:  ULTRAM  Take 1 tablet (50 mg total) by mouth every 6 (six) hours as needed for moderate pain.     triamcinolone 55 MCG/ACT nasal inhaler  Commonly known as:  NASACORT AQ  Place 2 sprays into the nose daily.       No Known Allergies Follow-up Information   Follow up with Maximino Greenland, MD In 1 week.   Specialty:  Internal Medicine   Contact information:   [address] [place] [address] [phone]       Follow up with Forbes Cellar, MD In 2 months.   Specialties:  Neurology, Radiology   Contact information:   [address] [place] [address] [phone]        The results of significant diagnostics from this hospitalization (including  imaging, microbiology, ancillary and laboratory) are listed below for reference.    Significant Diagnostic Studies: Dg Chest 2 View  11/07/2013   CLINICAL DATA:  Facial numbness.  EXAM: CHEST  2 VIEW  COMPARISON:  Chest x-ray 04/11/2013.  FINDINGS: Mediastinum and hilar structures are normal. Heart size normal. Minimal basilar atelectasis. Lungs are otherwise clear. No pleural effusion or pneumothorax. Surgical clips left axilla. No focal acute bony abnormality. Degenerative changes thoracic spine. Prior cervical spine fusion.  IMPRESSION: Minimal atelectasis in the lung bases. No acute cardiopulmonary disease.   Electronically Signed   By: Marcello Moores   Register   On: 11/07/2013 14:19   Dg Cervical Spine 2 Or 3 Views  11/08/2013   CLINICAL DATA:  Right-sided neck pain and numbness  EXAM: CERVICAL SPINE - 2-3 VIEW  COMPARISON:  None.  FINDINGS: C1 through the cervicothoracic junction is visualized in its entirety. No precervical soft tissue widening. The patient demonstrates apparent solid fusion from C7. Vertebral body height is maintained. No evidence for hardware failure. Allowing for two view technique, no fracture identified.  IMPRESSION: No acute abnormality.   Electronically Signed   By: Conchita Paris M.D.   On: 11/08/2013 15:22   Ct Head Wo Contrast  11/07/2013   CLINICAL DATA:  Confusion and right-sided headache that radiates down the right side of the face. Right facial numbness and tingling. Right hand numbness and tingling. Code stroke.  EXAM: CT HEAD WITHOUT CONTRAST  TECHNIQUE: Contiguous axial images were obtained from the base of the skull through the vertex without intravenous contrast.  COMPARISON:  No priors.  FINDINGS: Patchy areas of mild decreased attenuation in the periventricular white matter of the cerebral hemispheres bilaterally, most compatible with mild chronic microvascular ischemic disease. No acute intracranial abnormalities. Specifically, no evidence of acute intracranial hemorrhage, no definite findings of acute/subacute cerebral ischemia, no mass, mass effect, hydrocephalus or abnormal intra or extra-axial fluid collections. Visualized paranasal sinuses and mastoids are well pneumatized. No acute displaced skull fractures are identified.  IMPRESSION: 1. No acute intracranial abnormalities. 2. Mild chronic microvascular ischemic changes in the cerebral white matter.   Electronically Signed   By: Vinnie Langton M.D.   On: 11/07/2013 11:30   Mr Brain Wo Contrast  11/07/2013   CLINICAL DATA:  Numbness and tingling right-sided face and arm. Hypertensive smoker with history of breast cancer post chemotherapy radiation  therapy 1990.  EXAM: MRI HEAD WITHOUT CONTRAST  MRA HEAD WITHOUT CONTRAST  TECHNIQUE: Multiplanar, multiecho pulse sequences of the brain and surrounding structures were obtained without intravenous contrast. Angiographic images of the head were obtained using MRA technique without contrast.  COMPARISON:  11/07/2013 CT.  FINDINGS: MRI HEAD FINDINGS  No acute infarct. Small regions of artifact on diffusion sequence incidentally noted.  No intracranial hemorrhage.  Several small punctate and patchy matter type changes most consistent with result of small vessel disease.  No intracranial mass lesion noted on this unenhanced exam.  No hydrocephalus.  Abnormal appearance left vertebral artery.  Please see below.  Minimal paranasal sinus mucosal thickening.  Transverse ligament hypertrophy with mild encroachment upon the ventral aspect of the cervical medullary junction. Postsurgical changes upper cervical spine.  Partially empty sella probably an incidental finding.  Minimal exophthalmos.  Decrease pre pontine space but without other findings of benign intracranial hypotension.  MRA HEAD FINDINGS  Loss of signal distal vertebral artery level new. Despite this artifact, left vertebral artery appears occluded. Difficult to adequately assess the right vertebral artery which appears  grossly patent.  Ectatic basilar artery with mild to slightly moderate narrowing proximal basilar artery. Artifact may contribute to this appearance.  Poor delineation of the posterior inferior cerebellar artery and anterior inferior cerebellar artery bilaterally.  Bulbous appearance of the basilar tip without aneurysm.  Fetal type contribution to the posterior cerebral arteries.  Ectatic cavernous segment internal carotid artery with irregularity more notable on the left.  Decrease number of visualized middle cerebral artery branches. Branches which are visualized appear slightly narrowed and irregular.  Narrowing and irregularity of the A2  segment of the left anterior cerebral artery.  IMPRESSION: MR brain:  No acute infarct.  Please see above.  MR angiogram:  Left vertebral artery is not visualized and may be occluded.  Intracranial atherosclerotic type changes otherwise as detailed above.   Electronically Signed   By: Chauncey Cruel M.D.   On: 11/07/2013 17:52   Mr Jodene Nam Head/brain Wo Cm  11/07/2013   CLINICAL DATA:  Numbness and tingling right-sided face and arm. Hypertensive smoker with history of breast cancer post chemotherapy radiation therapy 1990.  EXAM: MRI HEAD WITHOUT CONTRAST  MRA HEAD WITHOUT CONTRAST  TECHNIQUE: Multiplanar, multiecho pulse sequences of the brain and surrounding structures were obtained without intravenous contrast. Angiographic images of the head were obtained using MRA technique without contrast.  COMPARISON:  11/07/2013 CT.  FINDINGS: MRI HEAD FINDINGS  No acute infarct. Small regions of artifact on diffusion sequence incidentally noted.  No intracranial hemorrhage.  Several small punctate and patchy matter type changes most consistent with result of small vessel disease.  No intracranial mass lesion noted on this unenhanced exam.  No hydrocephalus.  Abnormal appearance left vertebral artery.  Please see below.  Minimal paranasal sinus mucosal thickening.  Transverse ligament hypertrophy with mild encroachment upon the ventral aspect of the cervical medullary junction. Postsurgical changes upper cervical spine.  Partially empty sella probably an incidental finding.  Minimal exophthalmos.  Decrease pre pontine space but without other findings of benign intracranial hypotension.  MRA HEAD FINDINGS  Loss of signal distal vertebral artery level new. Despite this artifact, left vertebral artery appears occluded. Difficult to adequately assess the right vertebral artery which appears grossly patent.  Ectatic basilar artery with mild to slightly moderate narrowing proximal basilar artery. Artifact may contribute to this  appearance.  Poor delineation of the posterior inferior cerebellar artery and anterior inferior cerebellar artery bilaterally.  Bulbous appearance of the basilar tip without aneurysm.  Fetal type contribution to the posterior cerebral arteries.  Ectatic cavernous segment internal carotid artery with irregularity more notable on the left.  Decrease number of visualized middle cerebral artery branches. Branches which are visualized appear slightly narrowed and irregular.  Narrowing and irregularity of the A2 segment of the left anterior cerebral artery.  IMPRESSION: MR brain:  No acute infarct.  Please see above.  MR angiogram:  Left vertebral artery is not visualized and may be occluded.  Intracranial atherosclerotic type changes otherwise as detailed above.   Electronically Signed   By: Chauncey Cruel M.D.   On: 11/07/2013 17:52    Microbiology: No results found for this or any previous visit (from the past 240 hour(s)).   Labs: Basic Metabolic Panel:  Recent Labs Lab 11/07/13 1116 11/07/13 1127  NA 143 141  K 3.9 3.7  CL 102 102  CO2 27  --   GLUCOSE 117* 117*  BUN 9 8  CREATININE 0.88 1.00  CALCIUM 9.1  --    Liver Function Tests:  Recent Labs Lab 11/07/13 1116  AST 26  ALT 20  ALKPHOS 92  BILITOT 0.4  PROT 7.5  ALBUMIN 4.0   No results found for this basename: LIPASE, AMYLASE,  in the last 168 hours No results found for this basename: AMMONIA,  in the last 168 hours CBC:  Recent Labs Lab 11/07/13 1116 11/07/13 1127  WBC 6.1  --   NEUTROABS 3.8  --   HGB 14.0 15.0  HCT 41.0 44.0  MCV 88.9  --   PLT 223  --    Cardiac Enzymes:  Recent Labs Lab 11/07/13 1116  TROPONINI <0.30   BNP: BNP (last 3 results) No results found for this basename: PROBNP,  in the last 8760 hours CBG:  Recent Labs Lab 11/07/13 1059 11/07/13 1150  GLUCAP 124* 101*       Signed:  Jaymarion Trombly  [facility] 11/09/2013, 2:50 PM

## 2013-11-09 NOTE — Evaluation (Signed)
Speech Language Pathology Evaluation Patient Details Name: Penny Mathews MRN: [id number] DOB: 1934-07-06 Today's Date: 11/09/2013 Time: 6010-9323 SLP Time Calculation (min): 20 min  Problem List:  Patient Active Problem List   Diagnosis Date Noted  . CVA (cerebral infarction) 11/07/2013  . Hypertension 11/07/2013  . Stroke 11/07/2013   Past Medical History:  Past Medical History  Diagnosis Date  . Cancer     Breast Ca - chemo & radiation 1990  . Hypertension    Past Surgical History:  Past Surgical History  Procedure Laterality Date  . Abdominal hysterectomy    . Cervical fusion    . Breast lumpectomy    . Fractured leg x 2     HPI:  Penny Mathews is a 80 y.o. female with PMH significant for Hypertension, current smoker, history of breast cancer S/P chemo radiation 1990 who presents to the ED complaining of cramps, numbness and tingling of right side of her face, neck , shoulder and fingers. Patient currently c/o pain in right side of neck.    Assessment / Plan / Recommendation Clinical Impression  Pt administered a cognitive-linguistic evaluation.  Pt's overall cognitive function appears [facility] for all tasks assessed in regards to working memory, functional problem solving, awareness and attention. Pt was 100% intelligible without evidence of any language impairment and followed 3 step commands without difficulty. Pt appears she is at her baseline level of cognitive-linguistic functioning, therefore, skilled SLP intervention is not warranted at this time. Pt verbalizes understanding and is in agreement.     SLP Assessment  Patient does not need any further Speech Lanaguage Pathology Services    Follow Up Recommendations  None    Frequency and Duration N/A    Pertinent Vitals/Pain N/A   SLP Goals  N/A  SLP Evaluation Prior Functioning  Cognitive/Linguistic Baseline: Within functional limits Type of Home: House  Lives With: Alone Available Help at Discharge:  Family;Friend(s);Available PRN/intermittently   Cognition  Overall Cognitive Status: Within Functional Limits for tasks assessed Arousal/Alertness: Awake/alert Orientation Level: Oriented X4 Memory: Appears intact Awareness: Appears intact Problem Solving: Appears intact Safety/Judgment: Appears intact    Comprehension  Auditory Comprehension Overall Auditory Comprehension: Appears within functional limits for tasks assessed Conversation: Complex Visual Recognition/Discrimination Discrimination: Within Function Limits Reading Comprehension Reading Status: Within funtional limits    Expression Expression Primary Mode of Expression: Verbal Verbal Expression Overall Verbal Expression: Appears within functional limits for tasks assessed Initiation: No impairment Repetition: No impairment Naming: No impairment Written Expression Dominant Hand: Right Written Expression: Within Functional Limits   Oral / Motor Oral Motor/Sensory Function Overall Oral Motor/Sensory Function: Appears within functional limits for tasks assessed Motor Speech Overall Motor Speech: Appears within functional limits for tasks assessed Respiration: Within functional limits Phonation: Normal Resonance: Within functional limits Articulation: Within functional limitis Intelligibility: Intelligible Motor Planning: Witnin functional limits   GO     Penny Mathews 11/09/2013, 9:56 AM

## 2013-11-09 NOTE — Progress Notes (Signed)
Discharge instructions given. Pt verbalized understanding and all questions were answered.  

## 2013-11-09 NOTE — Progress Notes (Signed)
Occupational Therapy Treatment and Discharge Patient Details Name: Penny Mathews MRN: [id number] DOB: Jan 23, 1934 Today's Date: 11/09/2013 Time: 1100-1108 OT Time Calculation (min): 8 min  OT Assessment / Plan / Recommendation  History of present illness  Penny Mathews is a 80 y.o. female with PMH significant for Hypertension, current smoker, history of breast cancer S/P chemo radiation 1990 who presents to the ED complaining of cramps, numbness and tingling of right side of her face, neck , shoulder and fingers.  Patient currently c/o pain in right side of neck.  No issues in face or RUE.   OT comments  Pt reports no pain today.  Pt instructed in neck ROM and shoulder AROM exercises and demonstrated understanding.  Goals achieved.  Not further OT indicated.  Pt agrees with progress, goals achieved and plan for discharge from OT  Follow Up Recommendations  No OT follow up    Barriers to Discharge       Equipment Recommendations  None recommended by OT    Recommendations for Other Services    Frequency     Progress towards OT Goals Progress towards OT goals: Goals met/education completed, patient discharged from OT  Plan      Precautions / Restrictions Precautions Precautions: None   Pertinent Vitals/Pain     ADL  ADL Comments: Pt reports that pain is 0/10.  She demonstrates neck ROM at her baseline.  Pt instructed to perform Bil. UE shoulder flex; abduction; and neck rotation, lateral flexion and flex/ext    OT Diagnosis:    OT Problem List:   OT Treatment Interventions:     OT Goals(current goals can now be found in the care plan section)    Visit Information  Last OT Received On: 11/09/13 Assistance Needed: +1 History of Present Illness:  Penny Mathews is a 80 y.o. female with PMH significant for Hypertension, current smoker, history of breast cancer S/P chemo radiation 1990 who presents to the ED complaining of cramps, numbness and tingling of right side of her  face, neck , shoulder and fingers.  Patient currently c/o pain in right side of neck.  No issues in face or RUE.    Subjective Data      Prior Functioning  Home Living Available Help at Discharge: Family;Friend(s);Available PRN/intermittently Type of Home: House  Lives With: Alone Dominant Hand: Right    Cognition  Cognition Arousal/Alertness: Awake/alert Behavior During Therapy: WFL for tasks assessed/performed Overall Cognitive Status: Within Functional Limits for tasks assessed    Mobility       Exercises  Shoulder Exercises Shoulder Flexion: AROM;Right;5 reps;Seated Shoulder Extension: AROM;Right;5 reps;Seated Shoulder ABduction: AROM;Right;5 reps;Seated Neck Flexion: AROM;5 reps;Seated Neck Extension: AROM;5 reps;Seated Neck Lateral Flexion - Right: AROM;5 reps;Seated Neck Lateral Flexion - Left: AROM;5 reps;Seated   Balance    End of Session OT - End of Session Activity Tolerance: Patient tolerated treatment well Patient left: in bed;with call bell/phone within reach;with family/visitor present  [place], Ellard Artis M 11/09/2013, 11:34 AM

## 2013-11-09 NOTE — Progress Notes (Signed)
   CARE MANAGEMENT NOTE 11/09/2013  Patient:  Penny Mathews, Penny Mathews   Account Number:  [id number]  Date Initiated:  11/09/2013  Documentation initiated by:  Olga Coaster  Subjective/Objective Assessment:   ADMITTED FOR CVA WORKUP     Action/Plan:   CM FOLLOWING FOR DCP   Anticipated DC Date:  11/10/2013   Anticipated DC Plan:  [place]  CM consult          Status of service:  In process, will continue to follow Medicare Important Message given?   (If response is "NO", the following Medicare IM given date fields will be blank)  Per UR Regulation:  Reviewed for med. necessity/level of care/duration of stay  Comments:  1/26/2015Mindi Slicker RN,BSN,MHA [phone]

## 2013-11-09 NOTE — Progress Notes (Signed)
Stroke Team Progress Note  HISTORY Penny Mathews is a 80 y.o. female who awoke in her normal state around 7:30 on the morning of 11/07/2013. Around 8, after a shower she felt mildly confused, but also had sudden onset of right arm and face numbness which had been persistent. The chart stated "headache" but the patient clearly stated that her right face neck and arm were feeling "weird" and she "did NOT have a headache." The symptoms were persistent without improvement or worsening. LKW: 8 am tpa given?: no, mild symptoms.   SUBJECTIVE Patient feels back to baseline..  OBJECTIVE Most recent Vital Signs: Filed Vitals:   11/08/13 2210 11/09/13 0125 11/09/13 0445 11/09/13 1022  BP: 174/71 147/72 163/73 170/83  Pulse: 61 109 58 65  Temp: 98.1 F (36.7 C) 97.7 F (36.5 C) 97.5 F (36.4 C) 97.5 F (36.4 C)  TempSrc: Oral Oral Oral Oral  Resp: 18 18 18 18   Height:      Weight:      SpO2: 99% 98% 100% 98%   CBG (last 3)   Recent Labs  11/07/13 1059 11/07/13 1150  GLUCAP 124* 101*    IV Fluid Intake:     MEDICATIONS  . amLODipine  10 mg Oral Daily  . aspirin  300 mg Rectal Daily   Or  . aspirin  325 mg Oral Daily  . enoxaparin (LOVENOX) injection  40 mg Subcutaneous Q24H  . polyethylene glycol  17 g Oral Daily  . rosuvastatin  5 mg Oral q1800   PRN:  hydrALAZINE, senna-docusate, traMADol  Diet:  Cardiac thin liquids Activity:  Up with assistance DVT Prophylaxis:  Lovenox  CLINICALLY SIGNIFICANT STUDIES Basic Metabolic Panel:   Recent Labs Lab 11/07/13 1116 11/07/13 1127  NA 143 141  K 3.9 3.7  CL 102 102  CO2 27  --   GLUCOSE 117* 117*  BUN 9 8  CREATININE 0.88 1.00  CALCIUM 9.1  --    Liver Function Tests:   Recent Labs Lab 11/07/13 1116  AST 26  ALT 20  ALKPHOS 92  BILITOT 0.4  PROT 7.5  ALBUMIN 4.0   CBC:   Recent Labs Lab 11/07/13 1116 11/07/13 1127  WBC 6.1  --   NEUTROABS 3.8  --   HGB 14.0 15.0  HCT 41.0 44.0  MCV 88.9  --   PLT  223  --    Coagulation:   Recent Labs Lab 11/07/13 1116  LABPROT 12.7  INR 0.97   Cardiac Enzymes:   Recent Labs Lab 11/07/13 1116  TROPONINI <0.30   Urinalysis:   Recent Labs Lab 11/07/13 1140  COLORURINE YELLOW  LABSPEC 1.007  PHURINE 7.5  GLUCOSEU NEGATIVE  HGBUR TRACE*  BILIRUBINUR NEGATIVE  KETONESUR NEGATIVE  PROTEINUR NEGATIVE  UROBILINOGEN 0.2  NITRITE NEGATIVE  LEUKOCYTESUR TRACE*   Lipid Panel    Component Value Date/Time   CHOL 208* 11/08/2013 0510   TRIG 116 11/08/2013 0510   HDL 82 11/08/2013 0510   CHOLHDL 2.5 11/08/2013 0510   VLDL 23 11/08/2013 0510   LDLCALC 103* 11/08/2013 0510   HgbA1C  Lab Results  Component Value Date   HGBA1C 6.4* 11/08/2013    Urine Drug Screen:     Component Value Date/Time   LABOPIA NONE DETECTED 11/07/2013 1140   COCAINSCRNUR NONE DETECTED 11/07/2013 1140   LABBENZ NONE DETECTED 11/07/2013 1140   AMPHETMU NONE DETECTED 11/07/2013 1140   THCU NONE DETECTED 11/07/2013 1140   LABBARB NONE DETECTED 11/07/2013 1140  Alcohol Level:   Recent Labs Lab 11/07/13 1116  ETH <11    Dg Chest 2 View 11/07/2013    Minimal atelectasis in the lung bases. No acute cardiopulmonary disease.     Ct Head Wo Contrast 11/07/2013    1. No acute intracranial abnormalities. 2. Mild chronic microvascular ischemic changes in the cerebral white matter.     Mr Brain Wo Contrast 11/07/2013    No acute infarct. Small regions of artifact on diffusion sequence incidentally noted.  No intracranial hemorrhage.  Several small punctate and patchy matter type changes most consistent with result of small vessel disease.  No intracranial mass lesion noted on this unenhanced exam.  No hydrocephalus. Partially empty sella probably an incidental finding.   MR angiogram 11/07/2013 Left vertebral artery is not visualized and may be occluded.  Intracranial atherosclerotic type changes otherwise as detailed above.     2D Echocardiogram  ejection fraction  55-60%. No cardiac source of emboli identified.  Carotid Doppler  Preliminary report: Bilateral: 1-39% ICA stenosis. Vertebral artery flow is antegrade. Left vertebral artery flow is diminished with loss of diastolic component.  DG Cervical Spine no acute abnormality  EKG    Therapy Recommendations no physical or occupational therapy followup recommended   Physical Exam   Increased tone is noted of the trapezius and sternocliedomastiod muscle on the right side. Surgical scars are noted of the anterior neck regions bilaterally. Mental Status:  Patient is awake, alert, oriented to person, place, month, year, and situation.  Immediate and remote memory are intact.  Patient is able to give a clear and coherent history.  No signs of aphasia or neglect  Cranial Nerves:  II: Visual Fields are full. Pupils are equal, round, and reactive to light. Discs are difficult to visualize.  III,IV, VI: EOMI without ptosis or diploplia.  V: Facial sensation is decreased on the right.  VII: Facial movement is symmetric.  VIII: hearing is intact to voice  X: Uvula elevates symmetrically  XI: Shoulder shrug is symmetric.  XII: tongue is midline without atrophy or fasciculations.  Motor:  Tone is normal. Bulk is normal. 5/5 strength was present in all four extremities.  Sensory:  Sensation is diminished to pin on the right arm > leg.  Deep Tendon Reflexes:  2+ and symmetric in the biceps and patellae.  Plantars:  Toes are downgoing bilaterally.  Cerebellar:  FNF and HKS are intact bilaterally  Gait:  Not assessed due to acute nature of evaluation and multiple medical monitors in ED setting.   ASSESSMENT Penny Mathews is a 80 y.o. female presenting with right upper extremity and right facial numbness.TPA was not given as the symptoms were mild . MRI negative for acute infarct. Dx:  transient confusion and right face dysesthesia of unclear etiology in the setting of vascular risk factors. On No  antithrombotics prior to admission. Now on aspirin 325 mg orally every day. Patient with resultant resolution of deficits. Work up completed.   Tobacco history  Hypertension history  Hospital day # 2  TREATMENT/PLAN  Decrease aspirin to aspirin 81 mg orally every day.   Cancel EEG  Risk factor modification - smoking cessation.  EKG pending  No followup physical therapy recommended.  No further stroke workup indicated.  Patient has a 10-15% risk of having another stroke over the next year, the highest risk is within 2 weeks of the most recent stroke/TIA (risk of having a stroke following a stroke or TIA is the same).  Ongoing risk factor control by Primary Care Physician  Stroke Service will sign off. Please call should any needs arise.  Follow up with Dr. Leonie Man, [facility], in 2 months.    Burnetta Sabin, MSN, RN, ANVP-BC, ANP-BC, [facility] Pager: [phone] 11/09/2013 12:22 PM  I have personally obtained a history, examined the patient, evaluated imaging results, and formulated the assessment and plan of care. I agree with the above. Antony Contras, MD

## 2014-12-06 DIAGNOSIS — E1122 Type 2 diabetes mellitus with diabetic chronic kidney disease: Secondary | ICD-10-CM | POA: Diagnosis not present

## 2014-12-06 DIAGNOSIS — N182 Chronic kidney disease, stage 2 (mild): Secondary | ICD-10-CM | POA: Diagnosis not present

## 2014-12-06 DIAGNOSIS — I129 Hypertensive chronic kidney disease with stage 1 through stage 4 chronic kidney disease, or unspecified chronic kidney disease: Secondary | ICD-10-CM | POA: Diagnosis not present

## 2014-12-06 DIAGNOSIS — N08 Glomerular disorders in diseases classified elsewhere: Secondary | ICD-10-CM | POA: Diagnosis not present

## 2014-12-09 DIAGNOSIS — H2513 Age-related nuclear cataract, bilateral: Secondary | ICD-10-CM | POA: Diagnosis not present

## 2014-12-14 DIAGNOSIS — M5442 Lumbago with sciatica, left side: Secondary | ICD-10-CM | POA: Diagnosis not present

## 2014-12-16 DIAGNOSIS — M5442 Lumbago with sciatica, left side: Secondary | ICD-10-CM | POA: Diagnosis not present

## 2014-12-21 DIAGNOSIS — M5442 Lumbago with sciatica, left side: Secondary | ICD-10-CM | POA: Diagnosis not present

## 2014-12-23 DIAGNOSIS — M5442 Lumbago with sciatica, left side: Secondary | ICD-10-CM | POA: Diagnosis not present

## 2014-12-28 DIAGNOSIS — M5442 Lumbago with sciatica, left side: Secondary | ICD-10-CM | POA: Diagnosis not present

## 2014-12-30 DIAGNOSIS — M5442 Lumbago with sciatica, left side: Secondary | ICD-10-CM | POA: Diagnosis not present

## 2015-01-04 DIAGNOSIS — M5442 Lumbago with sciatica, left side: Secondary | ICD-10-CM | POA: Diagnosis not present

## 2015-01-06 DIAGNOSIS — M5442 Lumbago with sciatica, left side: Secondary | ICD-10-CM | POA: Diagnosis not present

## 2015-01-25 DIAGNOSIS — E780 Pure hypercholesterolemia: Secondary | ICD-10-CM | POA: Diagnosis not present

## 2015-01-25 DIAGNOSIS — M5442 Lumbago with sciatica, left side: Secondary | ICD-10-CM | POA: Diagnosis not present

## 2015-01-25 DIAGNOSIS — Z79899 Other long term (current) drug therapy: Secondary | ICD-10-CM | POA: Diagnosis not present

## 2015-01-25 DIAGNOSIS — N182 Chronic kidney disease, stage 2 (mild): Secondary | ICD-10-CM | POA: Diagnosis not present

## 2015-01-25 DIAGNOSIS — I129 Hypertensive chronic kidney disease with stage 1 through stage 4 chronic kidney disease, or unspecified chronic kidney disease: Secondary | ICD-10-CM | POA: Diagnosis not present

## 2015-02-14 DIAGNOSIS — F17210 Nicotine dependence, cigarettes, uncomplicated: Secondary | ICD-10-CM | POA: Diagnosis not present

## 2015-02-14 DIAGNOSIS — M5416 Radiculopathy, lumbar region: Secondary | ICD-10-CM | POA: Diagnosis not present

## 2015-02-14 DIAGNOSIS — Z716 Tobacco abuse counseling: Secondary | ICD-10-CM | POA: Diagnosis not present

## 2015-02-22 DIAGNOSIS — M545 Low back pain: Secondary | ICD-10-CM | POA: Diagnosis not present

## 2015-02-25 DIAGNOSIS — M47816 Spondylosis without myelopathy or radiculopathy, lumbar region: Secondary | ICD-10-CM | POA: Diagnosis not present

## 2015-03-07 DIAGNOSIS — N951 Menopausal and female climacteric states: Secondary | ICD-10-CM | POA: Diagnosis not present

## 2015-03-07 DIAGNOSIS — N08 Glomerular disorders in diseases classified elsewhere: Secondary | ICD-10-CM | POA: Diagnosis not present

## 2015-03-07 DIAGNOSIS — Z13820 Encounter for screening for osteoporosis: Secondary | ICD-10-CM | POA: Diagnosis not present

## 2015-03-07 DIAGNOSIS — I129 Hypertensive chronic kidney disease with stage 1 through stage 4 chronic kidney disease, or unspecified chronic kidney disease: Secondary | ICD-10-CM | POA: Diagnosis not present

## 2015-03-07 DIAGNOSIS — E1122 Type 2 diabetes mellitus with diabetic chronic kidney disease: Secondary | ICD-10-CM | POA: Diagnosis not present

## 2015-03-07 DIAGNOSIS — N182 Chronic kidney disease, stage 2 (mild): Secondary | ICD-10-CM | POA: Diagnosis not present

## 2015-09-07 DIAGNOSIS — N390 Urinary tract infection, site not specified: Secondary | ICD-10-CM | POA: Diagnosis not present

## 2015-09-07 DIAGNOSIS — Z0000 Encounter for general adult medical examination without abnormal findings: Secondary | ICD-10-CM | POA: Diagnosis not present

## 2015-09-07 DIAGNOSIS — N182 Chronic kidney disease, stage 2 (mild): Secondary | ICD-10-CM | POA: Diagnosis not present

## 2015-09-07 DIAGNOSIS — E1122 Type 2 diabetes mellitus with diabetic chronic kidney disease: Secondary | ICD-10-CM | POA: Diagnosis not present

## 2015-09-07 DIAGNOSIS — R69 Illness, unspecified: Secondary | ICD-10-CM | POA: Diagnosis not present

## 2015-09-07 DIAGNOSIS — I129 Hypertensive chronic kidney disease with stage 1 through stage 4 chronic kidney disease, or unspecified chronic kidney disease: Secondary | ICD-10-CM | POA: Diagnosis not present

## 2015-09-07 DIAGNOSIS — H6123 Impacted cerumen, bilateral: Secondary | ICD-10-CM | POA: Diagnosis not present

## 2015-09-12 DIAGNOSIS — N390 Urinary tract infection, site not specified: Secondary | ICD-10-CM | POA: Diagnosis not present

## 2015-09-12 DIAGNOSIS — Z0000 Encounter for general adult medical examination without abnormal findings: Secondary | ICD-10-CM | POA: Diagnosis not present

## 2015-09-12 DIAGNOSIS — H6123 Impacted cerumen, bilateral: Secondary | ICD-10-CM | POA: Diagnosis not present

## 2015-09-30 ENCOUNTER — Other Ambulatory Visit: Payer: Self-pay | Admitting: Internal Medicine

## 2015-09-30 ENCOUNTER — Other Ambulatory Visit: Payer: Self-pay | Admitting: Nurse Practitioner

## 2015-09-30 DIAGNOSIS — E041 Nontoxic single thyroid nodule: Secondary | ICD-10-CM

## 2015-10-06 ENCOUNTER — Ambulatory Visit
Admission: RE | Admit: 2015-10-06 | Discharge: 2015-10-06 | Disposition: A | Discharge status: Home / Self Care | Payer: Commercial Managed Care - HMO | Source: Ambulatory Visit | Attending: Internal Medicine | Admitting: Internal Medicine

## 2015-10-06 DIAGNOSIS — E042 Nontoxic multinodular goiter: Secondary | ICD-10-CM | POA: Diagnosis not present

## 2015-10-06 DIAGNOSIS — E041 Nontoxic single thyroid nodule: Secondary | ICD-10-CM

## 2015-10-18 ENCOUNTER — Other Ambulatory Visit: Payer: Self-pay | Admitting: Internal Medicine

## 2015-10-18 DIAGNOSIS — E041 Nontoxic single thyroid nodule: Secondary | ICD-10-CM

## 2015-10-24 ENCOUNTER — Ambulatory Visit
Admission: RE | Admit: 2015-10-24 | Discharge: 2015-10-24 | Disposition: A | Discharge status: Home / Self Care | Payer: Commercial Managed Care - HMO | Source: Ambulatory Visit | Attending: Internal Medicine | Admitting: Internal Medicine

## 2015-10-24 DIAGNOSIS — E042 Nontoxic multinodular goiter: Secondary | ICD-10-CM | POA: Diagnosis not present

## 2015-10-24 DIAGNOSIS — E041 Nontoxic single thyroid nodule: Secondary | ICD-10-CM

## 2015-10-24 MED ORDER — IOPAMIDOL (ISOVUE-300) INJECTION 61%
75.0000 mL | Freq: Once | INTRAVENOUS | Status: AC | PRN
Start: 2015-10-24 — End: 2015-10-24
  Administered 2015-10-24: 75 mL via INTRAVENOUS

## 2015-12-05 ENCOUNTER — Encounter: Payer: Self-pay | Admitting: Internal Medicine

## 2015-12-05 ENCOUNTER — Ambulatory Visit (INDEPENDENT_AMBULATORY_CARE_PROVIDER_SITE_OTHER): Payer: Commercial Managed Care - HMO | Admitting: Internal Medicine

## 2015-12-05 VITALS — BP 132/76 | HR 80 | Temp 98.2°F | Resp 12 | Wt 176.6 lb

## 2015-12-05 DIAGNOSIS — R221 Localized swelling, mass and lump, neck: Secondary | ICD-10-CM | POA: Diagnosis not present

## 2015-12-05 DIAGNOSIS — E041 Nontoxic single thyroid nodule: Secondary | ICD-10-CM | POA: Insufficient documentation

## 2015-12-05 NOTE — Progress Notes (Addendum)
Patient ID: Penny Mathews, female   DOB: 01-31-1934, 82 y.o.   MRN: [id number]   HPI  Penny Mathews is a 82 y.o.-year-old female, referred by her PCP, Dr.  Glendale Chard  [facility], FNP-BC), for evaluation and management of a L thyroid nodule and a possible L thyroid mass.  Pt went to see PCP for an annual physical exam in 08/2015 >> a mass palpated in neck >> sent for an U/S of the thyroid. Pt mentions she felt this mass "for years".  She had 2 cervical spine surgeries (ant neck incision): 1990 and 2004.  Thyroid U/S (10/06/2015):  Right thyroid lobe: 4.0 x 1.4 x 1.2 cm. The right thyroid tissue is homogeneous without a focal nodule.  Left thyroid lobe: 5.2 x 1.9 x 1.5 cm. Small hypoechoic nodule along the superior left thyroid lobe measures up to 0.3 cm. Small heterogeneous nodule in the inferior left thyroid lobe measuring up to 0.7 cm. Subtle isoechoic nodule or pseudo nodule in the inferior left thyroid lobe measures 1.5 x 0.9 x 1.5 cm.  Isthmus Thickness: 0.4 cm. No nodules visualized.  Lymphadenopathy There is a heterogeneous nodule separate from the superior left thyroid lobe. This nodule measures 2.7 x 0.8 x 1.4 cm. There is some vascular flow in this nodular structure.  IMPRESSION: Indeterminate 2.7 cm structure near the left thyroid lobe. This structure appears to be separate from the left thyroid tissue. This could represent ectopic thyroid tissue or possibly a very exophytic thyroid lesion but indeterminate.   CT neck (10/24/2015:  Thyroid: Left-sided thyroid nodules are again noted. The largest nodules at the inferior aspect of the left thyroid lobe measuring 11 mm maximally. This measured up to 15 mm on the ultrasound. The smaller 5 mm nodules also seen.  There is separate hyperdense tissue which is superior to the left lobe of thyroid. This does appear to be connected near its upper pole. This area measures 8 x 13 by 26 mm. There is a focal hypodense area within  this separate region measures 4 mm. This tissue is within the strap muscles and deep to the left external jugular vein.  Pt denies: - hoarseness - dysphagia - choking - SOB with lying down - but feels a nodule in neck  I reviewed pt's thyroid tests: 09/12/2015: TSH 2.15  Pt c/o: - fatigue - heat intolerance/cold intolerance - tremors - palpitations - anxiety/depression - hyperdefecation/constipation - weight loss/weight gain - dry skin - hair loss  No FH of thyroid ds. No FH of thyroid cancer. + h/o radiation tx to chest for BrCA in 1992.  No seaweed or kelp. + recent contrast studies. + steroid inj 05/2014. + herbal supplements (vitamins). No Biotin supplements or Hair, Skin and Nails vitamins (not at the time of last TSH)    + h/o HL, HTN.  ROS: Constitutional: no weight gain/loss, no fatigue, no subjective hyperthermia/hypothermia Eyes: no blurry vision, no xerophthalmia ENT: no sore throat, + nodules palpated in throat, no dysphagia/odynophagia, no hoarseness Cardiovascular: no CP/SOB/palpitations/leg swelling Respiratory: no cough/SOB Gastrointestinal: no N/V/D/C Musculoskeletal: no muscle/joint aches Skin: no rashes Neurological: no tremors/numbness/tingling/dizziness Psychiatric: no depression/anxiety  Past Medical History  Diagnosis Date  . Cancer ([place])     Breast Ca - chemo & radiation 1990  . Hypertension    Past Surgical History  Procedure Laterality Date  . Abdominal hysterectomy    . Cervical fusion    . Breast lumpectomy    . Fractured leg x 2  Social History   Social History  . Marital Status: Single    Spouse Name: N/A  . Number of Children: 2   Occupational History  .  retired    Social History Main Topics  . Smoking status: Current Every Day Smoker -- 0.20 packs/day (one pack per week)     Types: Cigarettes  . Smokeless tobacco: Not on file  . Alcohol Use: Yes     Comment: occationally at [employer] and thanks giving  . Drug  Use: Yes   Current Outpatient Prescriptions on File Prior to Visit  Medication Sig Dispense Refill  . amLODipine (NORVASC) 10 MG tablet Take 1 tablet (10 mg total) by mouth daily. 30 tablet 0  . aspirin 81 MG tablet Take 1 tablet (81 mg total) by mouth daily. (Patient not taking: Reported on 12/05/2015) 30 tablet   . traMADol (ULTRAM) 50 MG tablet Take 1 tablet (50 mg total) by mouth every 6 (six) hours as needed for moderate pain. 30 tablet 0   No current facility-administered medications on file prior to visit.   No Known Allergies   She has family history of hypertension, hyperlipidemia, cancer. Also see history of present illness.  PE: BP 132/76 mmHg  Pulse 80  Temp(Src) 98.2 F (36.8 C) (Oral)  Resp 12  Wt 176 lb 9.6 oz (80.105 kg)  SpO2 98% Body mass index is 27.65 kg/(m^2). Wt Readings from Last 3 Encounters:  12/05/15 176 lb 9.6 oz (80.105 kg)  11/07/13 175 lb 14.4 oz (79.788 kg)   Constitutional: overweight, in NAD Eyes: PERRLA, EOMI, no exophthalmos ENT: moist mucous membranes, no thyromegaly, but left-sided cervical elongated mass palpated, no cervical lymphadenopathy Cardiovascular: RRR, No MRG Respiratory: CTA B Gastrointestinal: abdomen soft, NT, ND, BS+ Musculoskeletal: no deformities, strength intact in all 4;  Skin: moist, warm, no rashes Neurological: no tremor with outstretched hands, DTR normal in all 4  ASSESSMENT: 1. Left Thyroid nodule   2. Left extrathyroidal mass  PLAN: 1. Left Thyroid nodule - I reviewed the images of her thyroid ultrasound along with the patient. I pointed out that the left thyroid nodule appears vaguely delimited, and could a pseudonodule. This does appear to have either colloid granules or microcalcifications, Otherwise, the nodule is: - not hypoechoic - without internal blood flow - more wide than tall  Pt does not have a thyroid cancer family history or a personal history of RxTx to head/neck. She did have RxTx for breast  cancer more than 20 years ago.  - the only way that we can tell exactly if it is cancer or not is by doing a thyroid biopsy (FNA). I explained what the test entails. - patient decided to have the FNA done now >> I ordered this.  - If this is cancerous, patient agrees with total thyroidectomy - If this is not cancerous, we can forego screening in the future, unless she starts developing neck compression symptoms  2. Left extrathyroidal mass  - Patient has a elongated mass superior to the left thyroid lobe. Per the CT scan report and images reviewed today with the patient, this area appears connected to the thyroid, however, it is situated inside the strap muscle.     Patient tells me that she has had cervical spine surgery years ago and she started to feel this mass several years after the surgery. - It is very difficult to know if this is thyroid tissue or not, so I suggested that we biopsied this mass, also. If  this is benign, no follow-up is necessary. - Patient agrees with the plan I will see her back if the above biopsies are abnormal.  Adequacy Reason Satisfactory For Evaluation. Diagnosis THYROID, FINE NEEDLE ASPIRATION, LLP (SPECIMEN 2 OF 2, COLLECTED ON 12/13/15): CONSISTENT WITH BENIGN FOLLICULAR NODULE (BETHESDA CATEGORY II). Claudette Laws MD Pathologist, Electronic Signature (Case signed 12/14/2015) Specimen Clinical Information Subtle isoechoic nodule or pseudo nodule in the inferior left thyroid lobe measures 1.5 x 0.9 x 1.5 cm Source Thyroid, Fine Needle Aspiration, LLP, (Specimen 2 of 2, collected on 12/13/2015)  Adequacy Reason Satisfactory For Evaluation. Diagnosis THYROID, FINE NEEDLE ASPIRATION, ADJACENT LUP (SPECIMEN 1 OF 2, COLLECTED ON 12/13/15): CONSISTENT WITH BENIGN FOLLICULAR NODULE (BETHESDA CATEGORY II). Claudette Laws MD Pathologist, Electronic Signature (Case signed 12/14/2015) Specimen Clinical Information Indeterminate 2.7 cm structure near the left  thyroid lobe, This structure appears to be separate from the left thyroid tissue, This could represent ectopic thyroid tissue or possibly a very exophytic thyroid lesion but indeterminate Source Thyroid, Fine Needle Aspiration, Tissue Near Lt Lobe Thyroid - (Specimen 1 of 2, collected on 12/13/2015)  Both the thyroid nodule and the thyroid mass are benign, therefore no follow-up is necessary.

## 2015-12-05 NOTE — Patient Instructions (Signed)
We will schedule the thyroid biopsies and let you know about time and date.  Please return if the biopsies are abnormal.  Thyroid Biopsy The thyroid gland is a butterfly-shaped gland located in the front of the neck. It produces hormones that affect metabolism, growth and development, and body temperature. Thyroid biopsy is a procedure in which small samples of tissue or fluid are removed from the thyroid gland. The samples are then looked at under a microscope to check for abnormalities. This procedure is done to determine the cause of thyroid problems. It may be done to check for infection, cancer, or other thyroid problems. Two methods may be used for a thyroid biopsy. In one method, a thin needle is inserted through the skin and into the thyroid gland. In the other method, an open incision is made through the skin. LET [facility] CARE PROVIDER KNOW ABOUT:   Any allergies you have.  All medicines you are taking, including vitamins, herbs, eye drops, creams, and over-the-counter medicines.  Previous problems you or members of your family have had with the use of anesthetics.  Any blood disorders you have.  Previous surgeries you have had.  Medical conditions you have. RISKS AND COMPLICATIONS Generally, this is a safe procedure. However, problems can occur and include:  Bleeding from the procedure site.  Infection.  Injury to structures near the thyroid gland. BEFORE THE PROCEDURE   Ask your health care provider about:  Changing or stopping your regular medicines. This is especially important if you are taking diabetes medicines or blood thinners.  Taking medicines such as aspirin and ibuprofen. These medicines can thin your blood. Do not take these medicines before your procedure if your health care provider asks you not to.  Do not eat or drink anything after midnight on the night before the procedure or as directed by your health care provider.  You may have a blood sample  taken. PROCEDURE Either of these methods may be used to perform a thyroid biopsy:  Fine needle biopsy. You may be given medicine to help you relax (sedative). You will be asked to lie on your back with your head tipped backward to extend your neck. An area on your neck will be cleaned. A needle will then be inserted through the skin of your neck. You may be asked to avoid coughing, talking, swallowing, or making sounds during some portions of the procedure. The needle will be withdrawn once the tissue or fluid samples have been removed. Pressure may be applied to your neck to reduce swelling and ensure that bleeding has stopped. The samples will be sent to a lab for examination.  Open biopsy. You will be given medicine to make you sleep (general anesthetic). An incision will be made in your neck. A sample of thyroid tissue will be removed using surgical tools. The tissue sample will be sent for examination. In some cases, the sample may be examined during the biopsy. If that is done and cancer cells are found, some or all of the thyroid gland may be removed. The incision will be closed with stitches. AFTER THE PROCEDURE   Your recovery will be assessed and monitored.  You may have soreness and tenderness at the site of the biopsy. This should go away after a few days.  If you had an open biopsy, you may have a hoarse voice or sore throat for a couple days.  It is your responsibility to get your test results.   This information is not  intended to replace advice given to you by your health care provider. Make sure you discuss any questions you have with your health care provider.   Document Released: 07/29/2007 Document Revised: 10/22/2014 Document Reviewed: 12/24/2013 Elsevier Interactive Patient Education [employer].

## 2015-12-13 ENCOUNTER — Other Ambulatory Visit (HOSPITAL_COMMUNITY)
Admission: RE | Admit: 2015-12-13 | Discharge: 2015-12-13 | Disposition: A | Discharge status: Home / Self Care | Payer: Commercial Managed Care - HMO | Source: Ambulatory Visit | Attending: Radiology | Admitting: Radiology

## 2015-12-13 ENCOUNTER — Ambulatory Visit
Admission: RE | Admit: 2015-12-13 | Discharge: 2015-12-13 | Disposition: A | Discharge status: Home / Self Care | Payer: Commercial Managed Care - HMO | Source: Ambulatory Visit | Attending: Internal Medicine | Admitting: Internal Medicine

## 2015-12-13 DIAGNOSIS — E041 Nontoxic single thyroid nodule: Secondary | ICD-10-CM | POA: Insufficient documentation

## 2015-12-13 DIAGNOSIS — E042 Nontoxic multinodular goiter: Secondary | ICD-10-CM | POA: Diagnosis not present

## 2015-12-15 ENCOUNTER — Encounter: Payer: Self-pay | Admitting: *Deleted

## 2016-01-04 DIAGNOSIS — H2513 Age-related nuclear cataract, bilateral: Secondary | ICD-10-CM | POA: Diagnosis not present

## 2016-03-07 DIAGNOSIS — N182 Chronic kidney disease, stage 2 (mild): Secondary | ICD-10-CM | POA: Diagnosis not present

## 2016-03-07 DIAGNOSIS — I129 Hypertensive chronic kidney disease with stage 1 through stage 4 chronic kidney disease, or unspecified chronic kidney disease: Secondary | ICD-10-CM | POA: Diagnosis not present

## 2016-03-07 DIAGNOSIS — R7309 Other abnormal glucose: Secondary | ICD-10-CM | POA: Diagnosis not present

## 2016-05-12 IMAGING — US US SOFT TISSUE HEAD/NECK
1 series · 13 of 25 positions shown · non-contrast
Comparison: None.

CLINICAL DATA: Thyroid nodule on physical examination.

EXAM:
THYROID ULTRASOUND
TECHNIQUE: Ultrasound examination of the thyroid gland and adjacent soft
tissues was performed.

[Series 1: us soft tissue head/neck · 0.07mm/px · 13 of 59 slices shown]
[im 1/59]
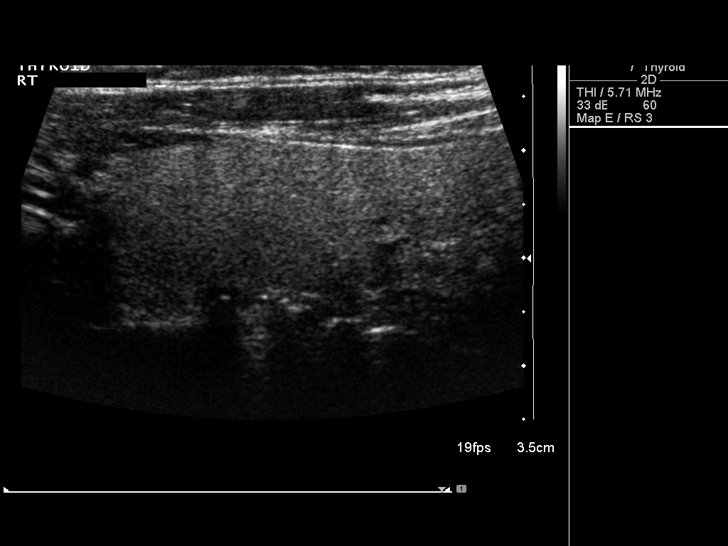
[im 5/59]
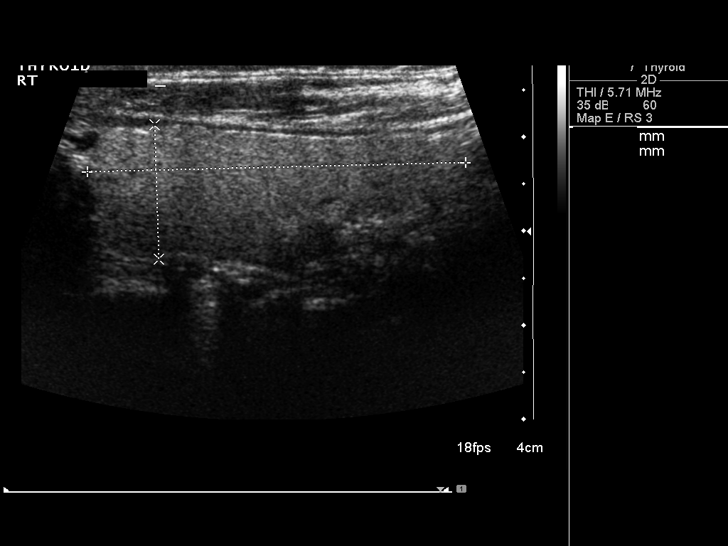
[im 10/59]
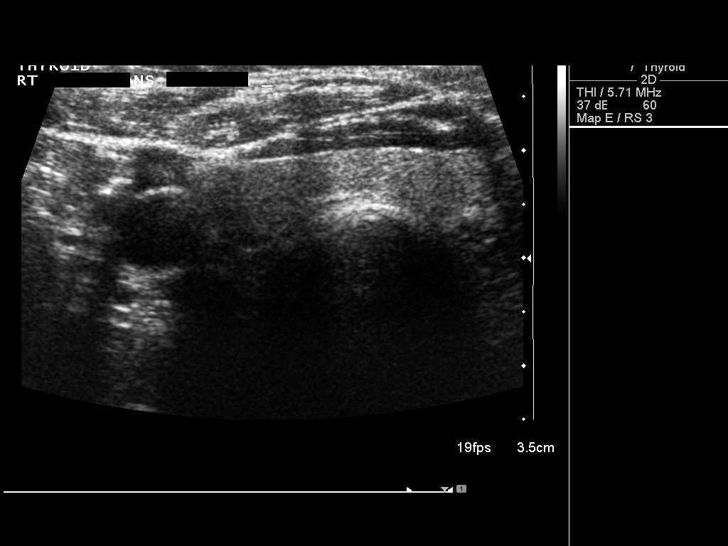
[im 15/59]
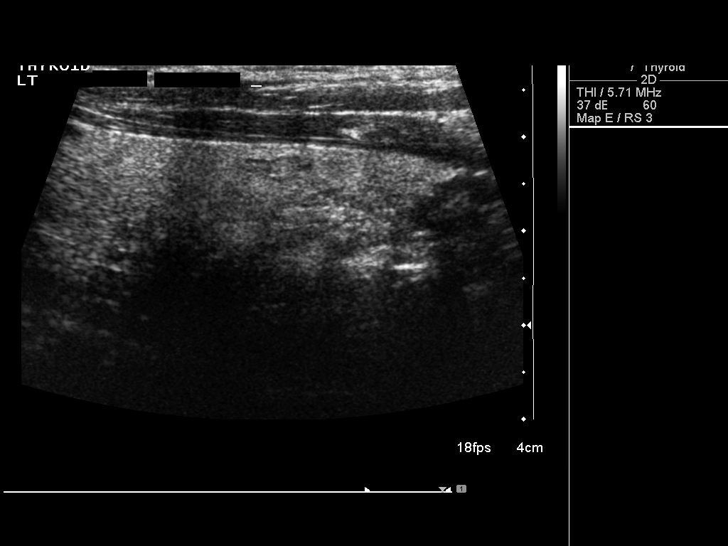
[im 20/59]
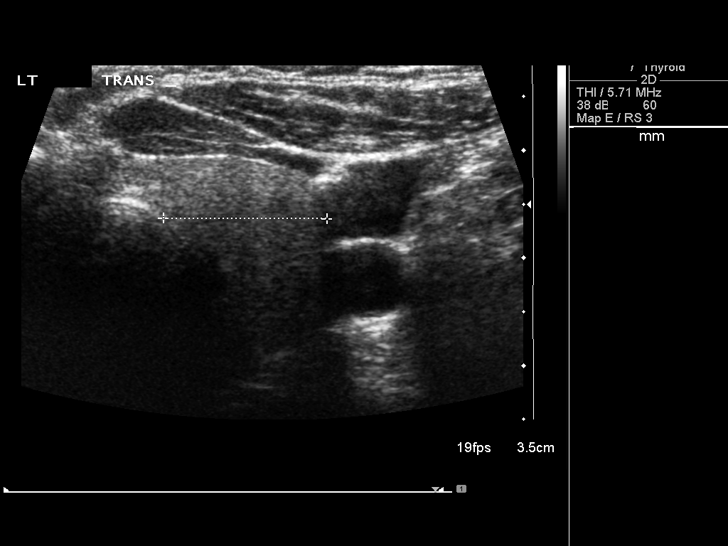
[im 25/59]
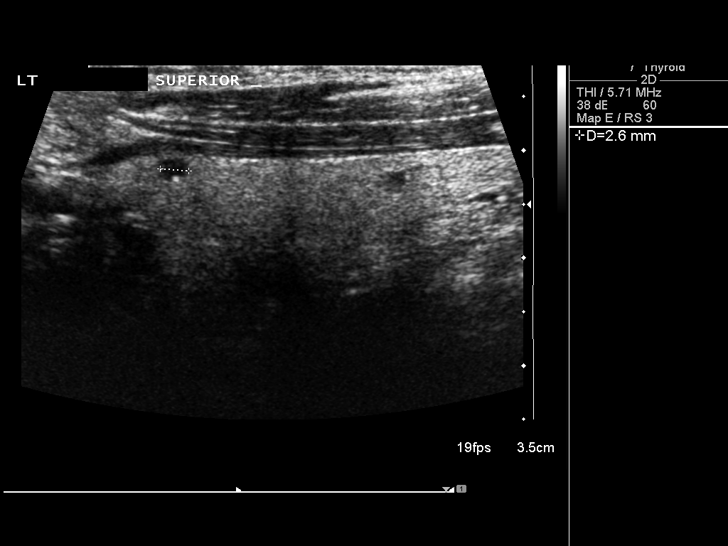
[im 30/59]
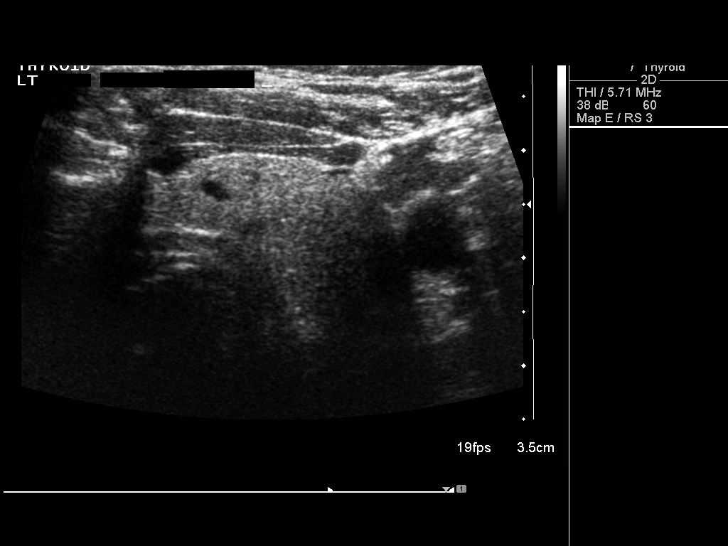
[im 34/59]
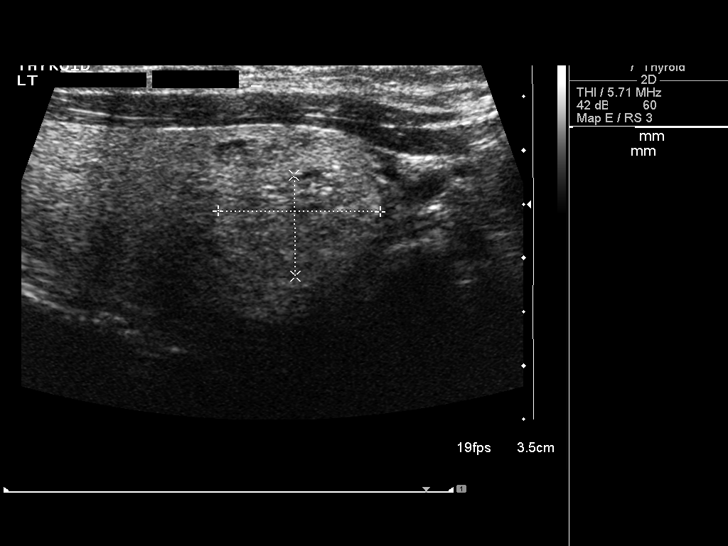
[im 39/59]
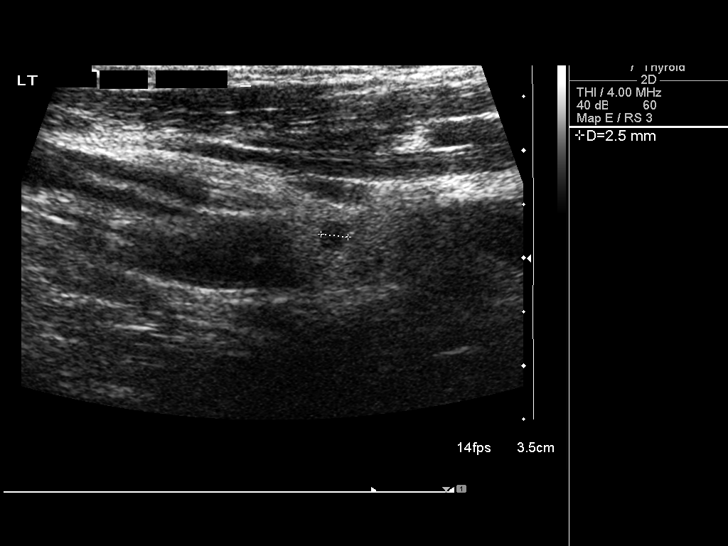
[im 44/59]
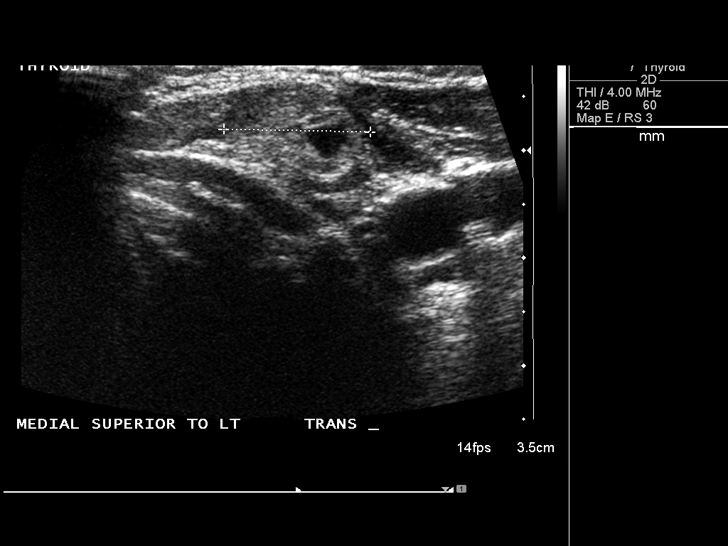
[im 49/59]
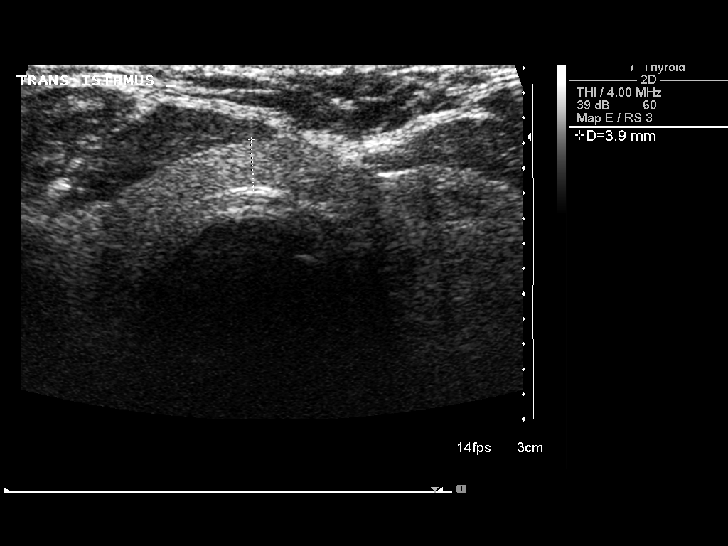
[im 54/59]
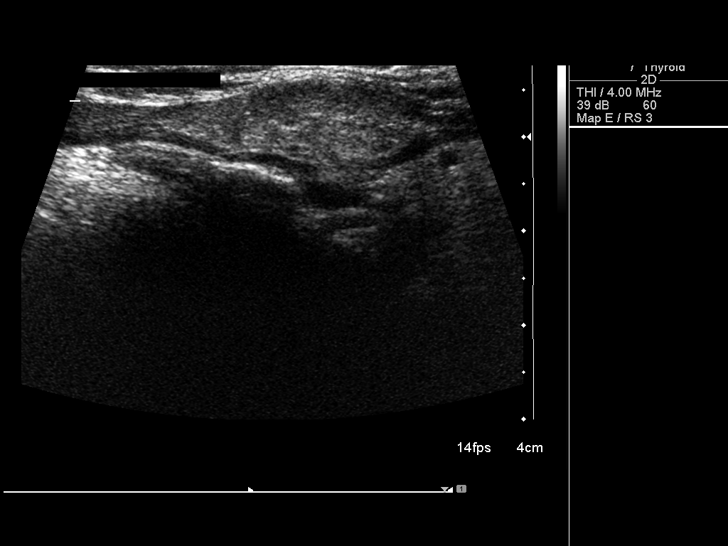
[im 59/59]
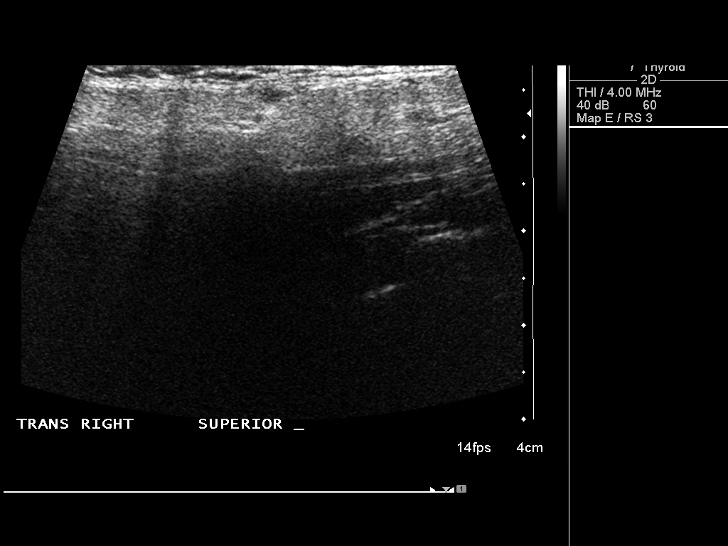

[13 of 25 positions shown; findings below may reference images not displayed]

FINDINGS: Right thyroid lobe

Measurements: 4.0 x 1.4 x 1.2 cm. The right thyroid tissue is
homogeneous without a focal nodule.

Left thyroid lobe

Measurements: 5.2 x 1.9 x 1.5 cm. Small hypoechoic nodule along the
superior left thyroid lobe measures up to 0.3 cm. Small
heterogeneous nodule in the inferior left thyroid lobe measuring up
to 0.7 cm. Subtle isoechoic nodule or pseudo nodule in the inferior
left thyroid lobe measures 1.5 x 0.9 x 1.5 cm.

Isthmus

Thickness: 0.4 cm.  No nodules visualized.

Lymphadenopathy

There is a heterogeneous nodule separate from the superior left
thyroid lobe. This nodule measures 2.7 x 0.8 x 1.4 cm. There is some
vascular flow in this nodular structure.
IMPRESSION: Indeterminate 2.7 cm structure near the left thyroid lobe. This
structure appears to be separate from the left thyroid tissue. This
could represent ectopic thyroid tissue or possibly a very exophytic
thyroid lesion but indeterminate. Consider further evaluation of
this structure with a post contrast neck CT.

Left thyroid nodules. Isoechoic nodule along the inferior left
thyroid lobe measures up to 1.5 cm. This nodule meets criteria for
ultrasound-guided biopsy. This recommendation follows the consensus
statement: Management of Thyroid Nodules Detected at US: Society of
Radiologists in Ultrasound Consensus Conference Statement. Radiology

## 2016-05-30 IMAGING — CT CT NECK W/ CM
2 of 3 series · 9 of 14 positions shown, 11 images · IV contrast (iopamidol)
Comparison: Thyroid ultrasound 10/06/2015

CLINICAL DATA: Thyroid nodule.

Creatinine was obtained on site at [HOSPITAL] at [HOSPITAL].Results: Creatinine 0.9 mg/dL.
EXAM:
CT NECK WITH CONTRAST
TECHNIQUE: Multidetector CT imaging of the neck was performed using the
standard protocol following the bolus administration of intravenous
contrast.
CONTRAST:  75mL [id number] IOPAMIDOL ([id number]) INJECTION 61%

[Series 3: neck · axial · 0.47mm/px · z∈[+504,+651]mm · 4 of 83 slices shown]
[im 17/83  bone]
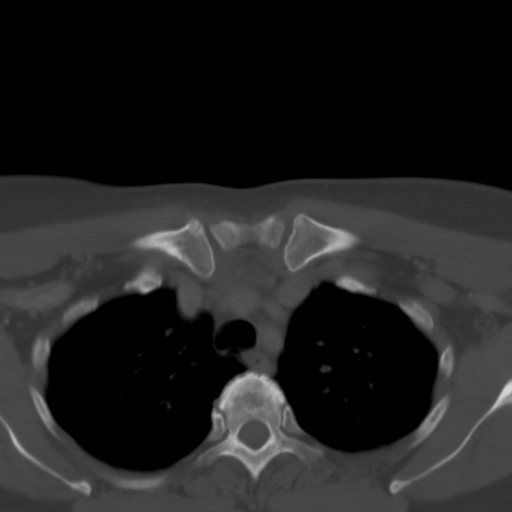
[im 33/83  bone]
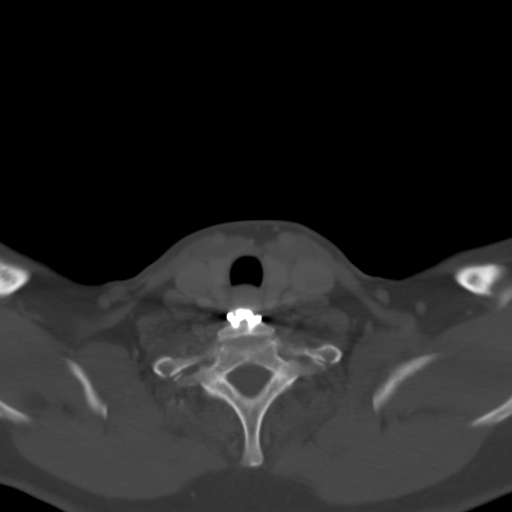
[im 50/83  bone]
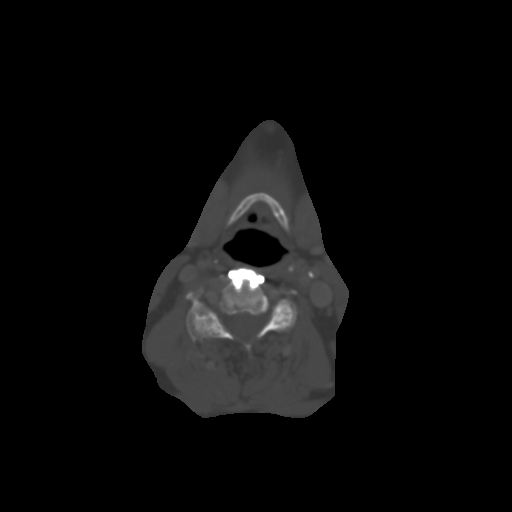
[im 66/83  bone]
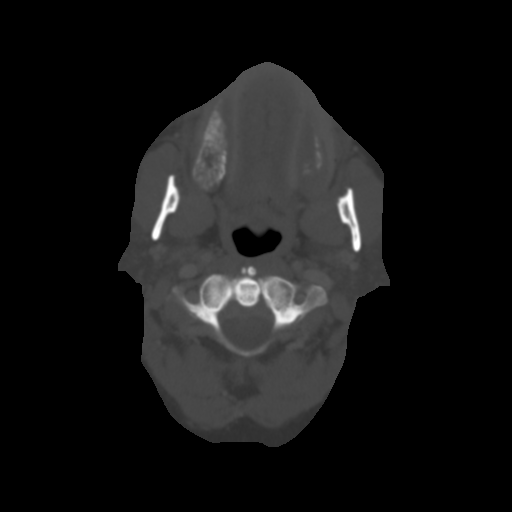

[Series 7: angled axial neck · axial · 0.43mm/px · z∈[+453,+636]mm · 5 of 99 slices shown, 7 images]
[im 17/99  soft-tissue]
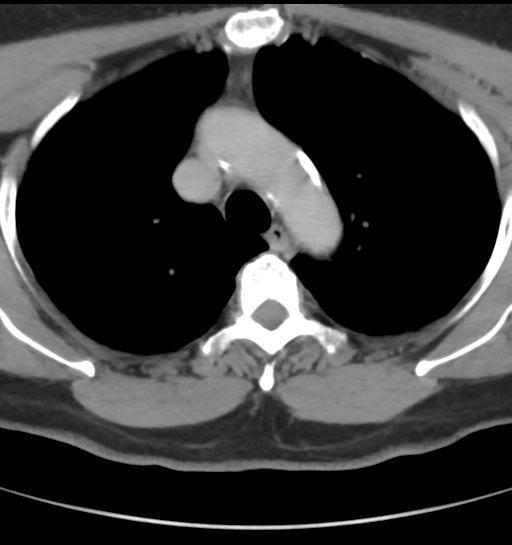
[im 17/99  bone]
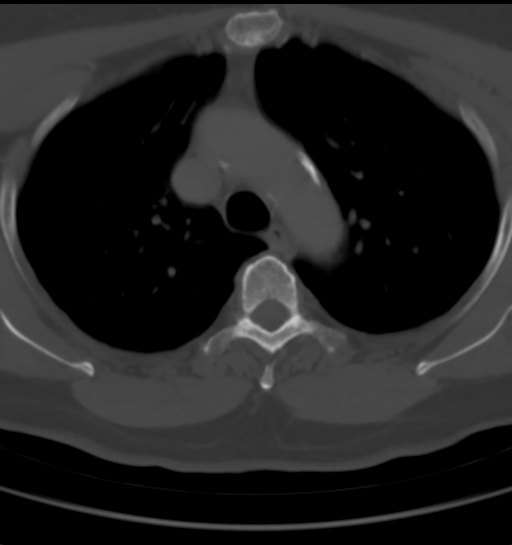
[im 33/99  bone]
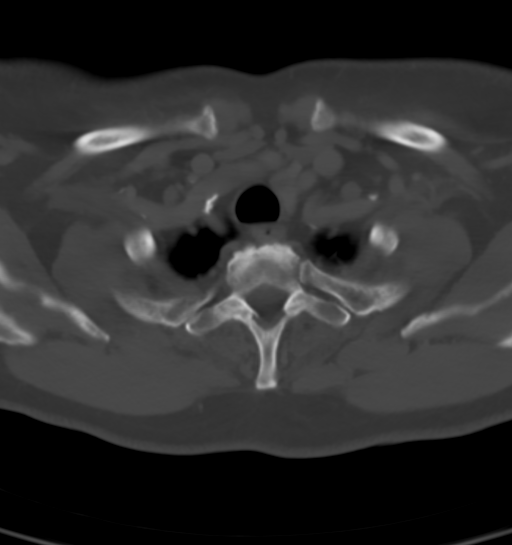
[im 50/99  bone]
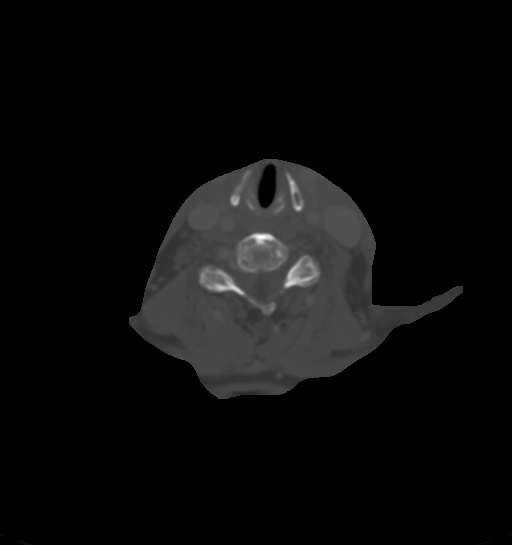
[im 66/99  bone]
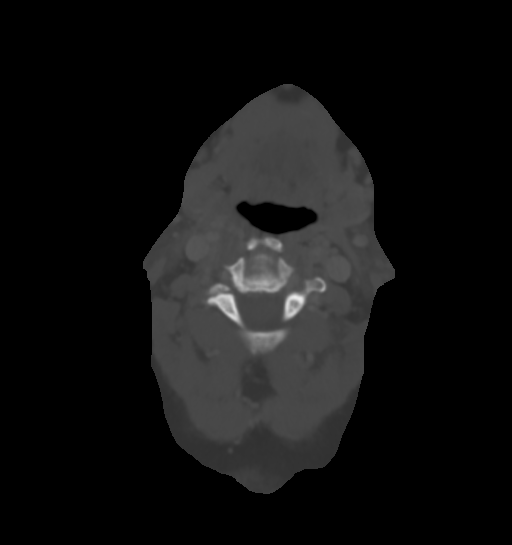
[im 82/99  soft-tissue]
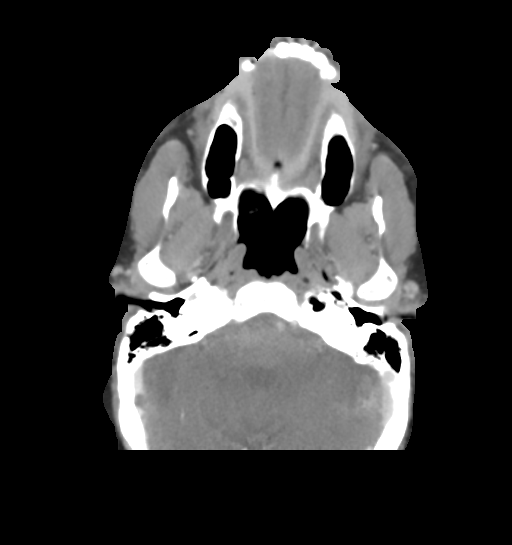
[im 82/99  bone]
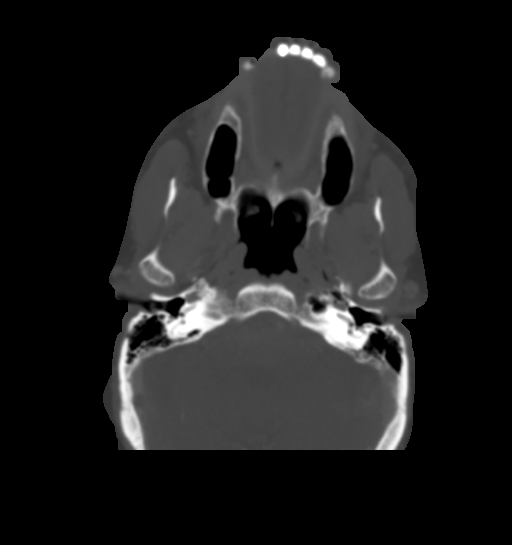

[9 of 14 positions shown; findings below may reference images not displayed]

FINDINGS: Pharynx and larynx: No focal mucosal or submucosal lesion is
present. The vocal cords are midline and symmetric.

Salivary glands: The submandibular and parotid glands are normal
bilaterally.

Thyroid: Left-sided thyroid nodules are again noted. The largest
nodules at the inferior aspect of the left thyroid lobe measuring 11
mm maximally. This measured up to 15 mm on the ultrasound. The
smaller 5 mm nodules also seen.

There is separate hyperdense tissue which is superior to the left
lobe of thyroid. This does appear to be connected near its upper
pole. This area measures 8 x 13 by 26 mm. There is a focal hypodense
area within this separate region measures 4 mm. This tissue is
within the strap muscles and deep to the left external jugular vein.

Lymph nodes: No significant adenopathy is present.

Vascular: Atherosclerotic calcifications are present at the aortic
arch without stenosis. Mild atherosclerotic calcifications are
present at the carotid bifurcations is well without significant
stenosis.

Limited intracranial: Within normal limits

Visualized orbits: Negative

Mastoids and visualized paranasal sinuses: Clear

Skeleton: The cervical spine is fused anteriorly from C3 through C7.
Osseous foraminal narrowing is present bilaterally at C6-7. Mild
endplate changes are present at C2-3 with osseous foraminal
narrowing on the right. Reconstruction of the mandible is noted.

Upper chest: The lung apices are clear.
IMPRESSION: 1. Hyperdense tissue superior to the left lobe of thyroid measures 8
x 13 x 26 mm. This likely represents ectopic thyroid although it may
be connected to the left lobe of the thyroid. Within this tissue is
a hyperdense 4 mm nodule.
2. 2 of the 3 left lobe nodules seen at ultrasound are again
identified.
3. No significant adenopathy.
4. Extensive cervical spine fusion.
5. Mild adjacent level disease at C2-3.

## 2016-06-07 DIAGNOSIS — N182 Chronic kidney disease, stage 2 (mild): Secondary | ICD-10-CM | POA: Diagnosis not present

## 2016-06-07 DIAGNOSIS — I129 Hypertensive chronic kidney disease with stage 1 through stage 4 chronic kidney disease, or unspecified chronic kidney disease: Secondary | ICD-10-CM | POA: Diagnosis not present

## 2016-06-07 DIAGNOSIS — E782 Mixed hyperlipidemia: Secondary | ICD-10-CM | POA: Diagnosis not present

## 2016-06-07 DIAGNOSIS — R7309 Other abnormal glucose: Secondary | ICD-10-CM | POA: Diagnosis not present

## 2016-08-20 ENCOUNTER — Encounter (HOSPITAL_COMMUNITY): Payer: Self-pay | Admitting: Emergency Medicine

## 2016-08-20 ENCOUNTER — Ambulatory Visit (HOSPITAL_COMMUNITY)
Admission: EM | Admit: 2016-08-20 | Discharge: 2016-08-20 | Disposition: A | Discharge status: Home / Self Care | Payer: Commercial Managed Care - HMO | Attending: Family Medicine | Admitting: Family Medicine

## 2016-08-20 DIAGNOSIS — H6123 Impacted cerumen, bilateral: Secondary | ICD-10-CM | POA: Diagnosis not present

## 2016-08-20 MED ORDER — NEOMYCIN-COLIST-HC-THONZONIUM 3.3-3-10-0.5 MG/ML OT SUSP
4.0000 [drp] | Freq: Four times a day (QID) | OTIC | Status: DC
  Administered 2016-08-20: 4 [drp] via OTIC

## 2016-08-20 MED ORDER — NEOMYCIN-POLYMYXIN-HC 3.5-10000-1 OT SUSP
OTIC | Status: AC
  Filled 2016-08-20: qty 10

## 2016-08-20 NOTE — ED Triage Notes (Addendum)
C/o headache that started yesterday and significantly worse today.  Patient reports sinus drainage, denies cough.  Pain radiating into left sode of neck

## 2016-08-20 NOTE — Discharge Instructions (Signed)
Use ear drops for 3-4 days. No q-tips, see your doctor as needed.

## 2016-08-20 NOTE — ED Provider Notes (Signed)
[facility]    CSN: [id number] Arrival date & time: 08/20/16  1055     History   Chief Complaint No chief complaint on file.   HPI Penny Mathews is a 82 y.o. female.   The history is provided by the patient.  Otalgia  Location:  Bilateral Behind ear:  No abnormality Quality:  Pressure Severity:  Moderate Onset quality:  Gradual Duration:  3 days Progression:  Unchanged Chronicity:  New Relieved by:  None tried Worsened by:  Nothing Ineffective treatments:  None tried Associated symptoms: congestion and headaches   Associated symptoms: no ear discharge and no fever     Past Medical History:  Diagnosis Date  . Cancer ([place])    Breast Ca - chemo & radiation 1990  . Hypertension     Patient Active Problem List   Diagnosis Date Noted  . Left thyroid nodule 12/05/2015  . Mass of thyroid region 12/05/2015  . CVA (cerebral infarction) 11/07/2013  . Hypertension 11/07/2013  . Stroke [facility]) 11/07/2013    Past Surgical History:  Procedure Laterality Date  . ABDOMINAL HYSTERECTOMY    . BREAST LUMPECTOMY    . CERVICAL FUSION    . Fractured Leg x 2      OB History    No data available       Home Medications    Prior to Admission medications   Medication Sig Start Date End Date Taking? Authorizing Provider  amLODipine (NORVASC) 10 MG tablet Take 1 tablet (10 mg total) by mouth daily. 11/09/13  Yes Geradine Girt, DO  aspirin 81 MG tablet Take 1 tablet (81 mg total) by mouth daily. 11/09/13   Geradine Girt, DO  pravastatin (PRAVACHOL) 40 MG tablet Take 40 mg by mouth daily.    Historical Provider, MD  traMADol (ULTRAM) 50 MG tablet Take 1 tablet (50 mg total) by mouth every 6 (six) hours as needed for moderate pain. 11/09/13   Geradine Girt, DO    Family History No family history on file.  Social History Social History  Substance Use Topics  . Smoking status: Current Every Day Smoker    Packs/day: 0.20    Types: Cigarettes  . Smokeless  tobacco: Not on file  . Alcohol use Yes     Comment: occationally at [place] and thanks giving     Allergies   Patient has no known allergies.   Review of Systems Review of Systems  Constitutional: Negative for fever.  HENT: Positive for congestion and ear pain. Negative for ear discharge, facial swelling and postnasal drip.   Neurological: Positive for headaches.  All other systems reviewed and are negative.    Physical Exam Triage Vital Signs ED Triage Vitals  Enc Vitals Group     BP 08/20/16 1307 189/83     Pulse Rate 08/20/16 1307 67     Resp 08/20/16 1307 22     Temp 08/20/16 1307 97.9 F (36.6 C)     Temp Source 08/20/16 1307 Oral     SpO2 08/20/16 1307 98 %     Weight --      Height --      Head Circumference --      Peak Flow --      Pain Score 08/20/16 1306 10     Pain Loc --      Pain Edu? --      Excl. in [place]? --    No data found.   Updated Vital  Signs BP 189/83 (BP Location: Right Arm)   Pulse 67   Temp 97.9 F (36.6 C) (Oral)   Resp 22   SpO2 98%   Visual Acuity Right Eye Distance:   Left Eye Distance:   Bilateral Distance:    Right Eye Near:   Left Eye Near:    Bilateral Near:     Physical Exam  Constitutional: She appears well-developed and well-nourished. No distress.  HENT:  Head: Normocephalic.  Right Ear: External ear normal. No drainage or tenderness. Decreased hearing is noted.  Left Ear: External ear normal. No drainage or tenderness. Decreased hearing is noted.  Ears:  Mouth/Throat: Oropharynx is clear and moist.  Nursing note and vitals reviewed.    UC Treatments / Results  Labs (all labs ordered are listed, but only abnormal results are displayed) Labs Reviewed - No data to display  EKG  EKG Interpretation None       Radiology No results found.  Procedures Procedures (including critical care time)  Medications Ordered in UC Medications - No data to display   Initial Impression / Assessment and Plan  / UC Course  I have reviewed the triage vital signs and the nursing notes.  Pertinent labs & imaging results that were available during my care of the patient were reviewed by me and considered in my medical decision making (see chart for details).  Clinical Course       Final Clinical Impressions(s) / UC Diagnoses   Final diagnoses:  None    New Prescriptions New Prescriptions   No medications on file     Billy Fischer, MD 08/21/16 2118

## 2016-09-10 DIAGNOSIS — I129 Hypertensive chronic kidney disease with stage 1 through stage 4 chronic kidney disease, or unspecified chronic kidney disease: Secondary | ICD-10-CM | POA: Diagnosis not present

## 2016-09-10 DIAGNOSIS — N390 Urinary tract infection, site not specified: Secondary | ICD-10-CM | POA: Diagnosis not present

## 2016-09-10 DIAGNOSIS — E1122 Type 2 diabetes mellitus with diabetic chronic kidney disease: Secondary | ICD-10-CM | POA: Diagnosis not present

## 2016-09-10 DIAGNOSIS — N182 Chronic kidney disease, stage 2 (mild): Secondary | ICD-10-CM | POA: Diagnosis not present

## 2016-09-10 DIAGNOSIS — Z0000 Encounter for general adult medical examination without abnormal findings: Secondary | ICD-10-CM | POA: Diagnosis not present

## 2016-09-10 DIAGNOSIS — E782 Mixed hyperlipidemia: Secondary | ICD-10-CM | POA: Diagnosis not present

## 2016-12-26 DIAGNOSIS — H2513 Age-related nuclear cataract, bilateral: Secondary | ICD-10-CM | POA: Diagnosis not present

## 2017-01-07 DIAGNOSIS — N182 Chronic kidney disease, stage 2 (mild): Secondary | ICD-10-CM | POA: Diagnosis not present

## 2017-01-07 DIAGNOSIS — I129 Hypertensive chronic kidney disease with stage 1 through stage 4 chronic kidney disease, or unspecified chronic kidney disease: Secondary | ICD-10-CM | POA: Diagnosis not present

## 2017-01-07 DIAGNOSIS — N08 Glomerular disorders in diseases classified elsewhere: Secondary | ICD-10-CM | POA: Diagnosis not present

## 2017-01-07 DIAGNOSIS — E1122 Type 2 diabetes mellitus with diabetic chronic kidney disease: Secondary | ICD-10-CM | POA: Diagnosis not present

## 2017-01-17 DIAGNOSIS — M1711 Unilateral primary osteoarthritis, right knee: Secondary | ICD-10-CM | POA: Diagnosis not present

## 2017-01-24 DIAGNOSIS — I708 Atherosclerosis of other arteries: Secondary | ICD-10-CM | POA: Diagnosis not present

## 2017-01-28 DIAGNOSIS — E785 Hyperlipidemia, unspecified: Secondary | ICD-10-CM | POA: Diagnosis not present

## 2017-01-28 DIAGNOSIS — I10 Essential (primary) hypertension: Secondary | ICD-10-CM | POA: Diagnosis not present

## 2017-01-28 DIAGNOSIS — Z86010 Personal history of colonic polyps: Secondary | ICD-10-CM | POA: Diagnosis not present

## 2017-02-05 DIAGNOSIS — M1711 Unilateral primary osteoarthritis, right knee: Secondary | ICD-10-CM | POA: Diagnosis not present

## 2017-02-12 DIAGNOSIS — M1711 Unilateral primary osteoarthritis, right knee: Secondary | ICD-10-CM | POA: Diagnosis not present

## 2017-02-19 DIAGNOSIS — M1711 Unilateral primary osteoarthritis, right knee: Secondary | ICD-10-CM | POA: Diagnosis not present

## 2017-05-13 DIAGNOSIS — N182 Chronic kidney disease, stage 2 (mild): Secondary | ICD-10-CM | POA: Diagnosis not present

## 2017-05-13 DIAGNOSIS — E1122 Type 2 diabetes mellitus with diabetic chronic kidney disease: Secondary | ICD-10-CM | POA: Diagnosis not present

## 2017-05-13 DIAGNOSIS — I129 Hypertensive chronic kidney disease with stage 1 through stage 4 chronic kidney disease, or unspecified chronic kidney disease: Secondary | ICD-10-CM | POA: Diagnosis not present

## 2017-05-13 DIAGNOSIS — N08 Glomerular disorders in diseases classified elsewhere: Secondary | ICD-10-CM | POA: Diagnosis not present

## 2017-07-02 DIAGNOSIS — M1711 Unilateral primary osteoarthritis, right knee: Secondary | ICD-10-CM | POA: Diagnosis not present

## 2017-07-02 DIAGNOSIS — Z79899 Other long term (current) drug therapy: Secondary | ICD-10-CM | POA: Diagnosis not present

## 2017-07-02 DIAGNOSIS — N182 Chronic kidney disease, stage 2 (mild): Secondary | ICD-10-CM | POA: Diagnosis not present

## 2017-07-02 DIAGNOSIS — I129 Hypertensive chronic kidney disease with stage 1 through stage 4 chronic kidney disease, or unspecified chronic kidney disease: Secondary | ICD-10-CM | POA: Diagnosis not present

## 2017-07-30 DIAGNOSIS — N182 Chronic kidney disease, stage 2 (mild): Secondary | ICD-10-CM | POA: Diagnosis not present

## 2017-07-30 DIAGNOSIS — Z720 Tobacco use: Secondary | ICD-10-CM | POA: Diagnosis not present

## 2017-07-30 DIAGNOSIS — I129 Hypertensive chronic kidney disease with stage 1 through stage 4 chronic kidney disease, or unspecified chronic kidney disease: Secondary | ICD-10-CM | POA: Diagnosis not present

## 2017-09-13 DIAGNOSIS — N182 Chronic kidney disease, stage 2 (mild): Secondary | ICD-10-CM | POA: Diagnosis not present

## 2017-09-13 DIAGNOSIS — N08 Glomerular disorders in diseases classified elsewhere: Secondary | ICD-10-CM | POA: Diagnosis not present

## 2017-09-13 DIAGNOSIS — Z1389 Encounter for screening for other disorder: Secondary | ICD-10-CM | POA: Diagnosis not present

## 2017-09-13 DIAGNOSIS — E1122 Type 2 diabetes mellitus with diabetic chronic kidney disease: Secondary | ICD-10-CM | POA: Diagnosis not present

## 2017-10-16 DIAGNOSIS — N08 Glomerular disorders in diseases classified elsewhere: Secondary | ICD-10-CM | POA: Diagnosis not present

## 2017-10-16 DIAGNOSIS — I129 Hypertensive chronic kidney disease with stage 1 through stage 4 chronic kidney disease, or unspecified chronic kidney disease: Secondary | ICD-10-CM | POA: Diagnosis not present

## 2017-10-16 DIAGNOSIS — N182 Chronic kidney disease, stage 2 (mild): Secondary | ICD-10-CM | POA: Diagnosis not present

## 2017-10-16 DIAGNOSIS — E1122 Type 2 diabetes mellitus with diabetic chronic kidney disease: Secondary | ICD-10-CM | POA: Diagnosis not present

## 2017-10-21 DIAGNOSIS — R350 Frequency of micturition: Secondary | ICD-10-CM | POA: Diagnosis not present

## 2017-10-21 DIAGNOSIS — R300 Dysuria: Secondary | ICD-10-CM | POA: Diagnosis not present

## 2017-10-21 DIAGNOSIS — R1012 Left upper quadrant pain: Secondary | ICD-10-CM | POA: Diagnosis not present

## 2017-10-21 DIAGNOSIS — I129 Hypertensive chronic kidney disease with stage 1 through stage 4 chronic kidney disease, or unspecified chronic kidney disease: Secondary | ICD-10-CM | POA: Diagnosis not present

## 2017-10-21 DIAGNOSIS — R143 Flatulence: Secondary | ICD-10-CM | POA: Diagnosis not present

## 2018-01-01 DIAGNOSIS — H2513 Age-related nuclear cataract, bilateral: Secondary | ICD-10-CM | POA: Diagnosis not present

## 2018-04-16 DIAGNOSIS — E559 Vitamin D deficiency, unspecified: Secondary | ICD-10-CM | POA: Diagnosis not present

## 2018-04-16 DIAGNOSIS — Z0000 Encounter for general adult medical examination without abnormal findings: Secondary | ICD-10-CM | POA: Diagnosis not present

## 2018-04-16 DIAGNOSIS — I129 Hypertensive chronic kidney disease with stage 1 through stage 4 chronic kidney disease, or unspecified chronic kidney disease: Secondary | ICD-10-CM | POA: Diagnosis not present

## 2018-04-16 DIAGNOSIS — N182 Chronic kidney disease, stage 2 (mild): Secondary | ICD-10-CM | POA: Diagnosis not present

## 2018-04-16 DIAGNOSIS — R7309 Other abnormal glucose: Secondary | ICD-10-CM | POA: Diagnosis not present

## 2018-09-18 ENCOUNTER — Encounter: Payer: Self-pay | Admitting: Internal Medicine

## 2018-09-18 ENCOUNTER — Ambulatory Visit (INDEPENDENT_AMBULATORY_CARE_PROVIDER_SITE_OTHER): Payer: Medicare HMO | Admitting: Internal Medicine

## 2018-09-18 ENCOUNTER — Other Ambulatory Visit: Payer: Self-pay

## 2018-09-18 ENCOUNTER — Ambulatory Visit (INDEPENDENT_AMBULATORY_CARE_PROVIDER_SITE_OTHER): Payer: Medicare HMO

## 2018-09-18 VITALS — BP 142/96 | HR 76 | Temp 97.8°F | Ht 66.0 in | Wt 173.4 lb

## 2018-09-18 VITALS — BP 142/96 | HR 76 | Temp 97.8°F | Ht 67.0 in | Wt 173.4 lb

## 2018-09-18 DIAGNOSIS — N182 Chronic kidney disease, stage 2 (mild): Secondary | ICD-10-CM | POA: Diagnosis not present

## 2018-09-18 DIAGNOSIS — I129 Hypertensive chronic kidney disease with stage 1 through stage 4 chronic kidney disease, or unspecified chronic kidney disease: Secondary | ICD-10-CM | POA: Diagnosis not present

## 2018-09-18 DIAGNOSIS — Z0000 Encounter for general adult medical examination without abnormal findings: Secondary | ICD-10-CM | POA: Diagnosis not present

## 2018-09-18 DIAGNOSIS — R7309 Other abnormal glucose: Secondary | ICD-10-CM | POA: Diagnosis not present

## 2018-09-18 DIAGNOSIS — Z7982 Long term (current) use of aspirin: Secondary | ICD-10-CM

## 2018-09-18 DIAGNOSIS — E7800 Pure hypercholesterolemia, unspecified: Secondary | ICD-10-CM

## 2018-09-18 LAB — POCT URINALYSIS DIPSTICK
BILIRUBIN UA: NEGATIVE
Glucose, UA: NEGATIVE
Ketones, UA: NEGATIVE
Nitrite, UA: NEGATIVE
Protein, UA: NEGATIVE
SPEC GRAV UA: 1.015 (ref 1.010–1.025)
Urobilinogen, UA: 0.2 E.U./dL
pH, UA: 7.5 (ref 5.0–8.0)

## 2018-09-18 LAB — CMP14+EGFR
ALBUMIN: 4.5 g/dL (ref 3.5–4.7)
ALT: 20 IU/L (ref 0–32)
AST: 28 IU/L (ref 0–40)
Albumin/Globulin Ratio: 1.9 (ref 1.2–2.2)
Alkaline Phosphatase: 107 IU/L (ref 39–117)
BUN/Creatinine Ratio: 8 — ABNORMAL LOW (ref 12–28)
BUN: 7 mg/dL — AB (ref 8–27)
Bilirubin Total: 0.3 mg/dL (ref 0.0–1.2)
CO2: 25 mmol/L (ref 20–29)
CREATININE: 0.84 mg/dL (ref 0.57–1.00)
Calcium: 9.4 mg/dL (ref 8.7–10.3)
Chloride: 101 mmol/L (ref 96–106)
GFR calc Af Amer: 75 mL/min/{1.73_m2} (ref >59)
GFR calc non Af Amer: 65 mL/min/{1.73_m2} (ref >59)
GLUCOSE: 103 mg/dL — AB (ref 65–99)
Globulin, Total: 2.4 g/dL (ref 1.5–4.5)
Potassium: 4.8 mmol/L (ref 3.5–5.2)
Sodium: 141 mmol/L (ref 134–144)
TOTAL PROTEIN: 6.9 g/dL (ref 6.0–8.5)

## 2018-09-18 LAB — POCT UA - MICROALBUMIN
Albumin/Creatinine Ratio, Urine, POC: 30
Creatinine, POC: 100 mg/dL
MICROALBUMIN (UR) POC: 10 mg/L

## 2018-09-18 LAB — CBC
HEMATOCRIT: 41.1 % (ref 34.0–46.6)
Hemoglobin: 14 g/dL (ref 11.1–15.9)
MCH: 30.6 pg (ref 26.6–33.0)
MCHC: 34.1 g/dL (ref 31.5–35.7)
MCV: 90 fL (ref 79–97)
Platelets: 260 10*3/uL (ref 150–450)
RBC: 4.58 x10E6/uL (ref 3.77–5.28)
RDW: 13 % (ref 12.3–15.4)
WBC: 5.7 10*3/uL (ref 3.4–10.8)

## 2018-09-18 LAB — HEMOGLOBIN A1C
Est. average glucose Bld gHb Est-mCnc: 128 mg/dL
Hgb A1c MFr Bld: 6.1 % — ABNORMAL HIGH (ref 4.8–5.6)

## 2018-09-18 MED ORDER — PRAVASTATIN SODIUM 40 MG PO TABS: 40.0000 mg | ORAL_TABLET | Freq: Every day | ORAL | 2 refills | Status: DC

## 2018-09-18 MED ORDER — AMLODIPINE BESYLATE 5 MG PO TABS: 5.0000 mg | ORAL_TABLET | Freq: Every day | ORAL | 2 refills | Status: DC

## 2018-09-18 MED ORDER — TELMISARTAN 20 MG PO TABS: 20.0000 mg | ORAL_TABLET | Freq: Every day | ORAL | 2 refills | Status: DC

## 2018-09-18 NOTE — Patient Instructions (Signed)
DASH Eating Plan DASH stands for "Dietary Approaches to Stop Hypertension." The DASH eating plan is a healthy eating plan that has been shown to reduce high blood pressure (hypertension). It may also reduce your risk for type 2 diabetes, heart disease, and stroke. The DASH eating plan may also help with weight loss. What are tips for following this plan? General guidelines  Avoid eating more than 2,300 mg (milligrams) of salt (sodium) a day. If you have hypertension, you may need to reduce your sodium intake to 1,500 mg a day.  Limit alcohol intake to no more than 1 drink a day for nonpregnant women and 2 drinks a day for men. One drink equals 12 oz of beer, 5 oz of wine, or 1 oz of hard liquor.  Work with your health care provider to maintain a healthy body weight or to lose weight. Ask what an ideal weight is for you.  Get at least 30 minutes of exercise that causes your heart to beat faster (aerobic exercise) most days of the week. Activities may include walking, swimming, or biking.  Work with your health care provider or diet and nutrition specialist (dietitian) to adjust your eating plan to your individual calorie needs. Reading food labels  Check food labels for the amount of sodium per serving. Choose foods with less than 5 percent of the Daily Value of sodium. Generally, foods with less than 300 mg of sodium per serving fit into this eating plan.  To find whole grains, look for the word "whole" as the first word in the ingredient list. Shopping  Buy products labeled as "low-sodium" or "no salt added."  Buy fresh foods. Avoid canned foods and premade or frozen meals. Cooking  Avoid adding salt when cooking. Use salt-free seasonings or herbs instead of table salt or sea salt. Check with your health care provider or pharmacist before using salt substitutes.  Do not fry foods. Cook foods using healthy methods such as baking, boiling, grilling, and broiling instead.  Cook with  heart-healthy oils, such as olive, canola, soybean, or sunflower oil. Meal planning   Eat a balanced diet that includes: ? 5 or more servings of fruits and vegetables each day. At each meal, try to fill half of your plate with fruits and vegetables. ? Up to 6-8 servings of whole grains each day. ? Less than 6 oz of lean meat, poultry, or fish each day. A 3-oz serving of meat is about the same size as a deck of cards. One egg equals 1 oz. ? 2 servings of low-fat dairy each day. ? A serving of nuts, seeds, or beans 5 times each week. ? Heart-healthy fats. Healthy fats called Omega-3 fatty acids are found in foods such as flaxseeds and coldwater fish, like sardines, salmon, and mackerel.  Limit how much you eat of the following: ? Canned or prepackaged foods. ? Food that is high in trans fat, such as fried foods. ? Food that is high in saturated fat, such as fatty meat. ? Sweets, desserts, sugary drinks, and other foods with added sugar. ? Full-fat dairy products.  Do not salt foods before eating.  Try to eat at least 2 vegetarian meals each week.  Eat more home-cooked food and less restaurant, buffet, and fast food.  When eating at a restaurant, ask that your food be prepared with less salt or no salt, if possible. What foods are recommended? The items listed may not be a complete list. Talk with your dietitian about what   dietary choices are best for you. Grains Whole-grain or whole-wheat bread. Whole-grain or whole-wheat pasta. Brown rice. Oatmeal. Quinoa. Bulgur. Whole-grain and low-sodium cereals. Pita bread. Low-fat, low-sodium crackers. Whole-wheat flour tortillas. Vegetables Fresh or frozen vegetables (raw, steamed, roasted, or grilled). Low-sodium or reduced-sodium tomato and vegetable juice. Low-sodium or reduced-sodium tomato sauce and tomato paste. Low-sodium or reduced-sodium canned vegetables. Fruits All fresh, dried, or frozen fruit. Canned fruit in natural juice (without  added sugar). Meat and other protein foods Skinless chicken or turkey. Ground chicken or turkey. Pork with fat trimmed off. Fish and seafood. Egg whites. Dried beans, peas, or lentils. Unsalted nuts, nut butters, and seeds. Unsalted canned beans. Lean cuts of beef with fat trimmed off. Low-sodium, lean deli meat. Dairy Low-fat (1%) or fat-free (skim) milk. Fat-free, low-fat, or reduced-fat cheeses. Nonfat, low-sodium ricotta or cottage cheese. Low-fat or nonfat yogurt. Low-fat, low-sodium cheese. Fats and oils Soft margarine without trans fats. Vegetable oil. Low-fat, reduced-fat, or light mayonnaise and salad dressings (reduced-sodium). Canola, safflower, olive, soybean, and sunflower oils. Avocado. Seasoning and other foods Herbs. Spices. Seasoning mixes without salt. Unsalted popcorn and pretzels. Fat-free sweets. What foods are not recommended? The items listed may not be a complete list. Talk with your dietitian about what dietary choices are best for you. Grains Baked goods made with fat, such as croissants, muffins, or some breads. Dry pasta or rice meal packs. Vegetables Creamed or fried vegetables. Vegetables in a cheese sauce. Regular canned vegetables (not low-sodium or reduced-sodium). Regular canned tomato sauce and paste (not low-sodium or reduced-sodium). Regular tomato and vegetable juice (not low-sodium or reduced-sodium). Pickles. Olives. Fruits Canned fruit in a light or heavy syrup. Fried fruit. Fruit in cream or butter sauce. Meat and other protein foods Fatty cuts of meat. Ribs. Fried meat. Bacon. Sausage. Bologna and other processed lunch meats. Salami. Fatback. Hotdogs. Bratwurst. Salted nuts and seeds. Canned beans with added salt. Canned or smoked fish. Whole eggs or egg yolks. Chicken or turkey with skin. Dairy Whole or 2% milk, cream, and half-and-half. Whole or full-fat cream cheese. Whole-fat or sweetened yogurt. Full-fat cheese. Nondairy creamers. Whipped toppings.  Processed cheese and cheese spreads. Fats and oils Butter. Stick margarine. Lard. Shortening. Ghee. Bacon fat. Tropical oils, such as coconut, palm kernel, or palm oil. Seasoning and other foods Salted popcorn and pretzels. Onion salt, garlic salt, seasoned salt, table salt, and sea salt. Worcestershire sauce. Tartar sauce. Barbecue sauce. Teriyaki sauce. Soy sauce, including reduced-sodium. Steak sauce. Canned and packaged gravies. Fish sauce. Oyster sauce. Cocktail sauce. Horseradish that you find on the shelf. Ketchup. Mustard. Meat flavorings and tenderizers. Bouillon cubes. Hot sauce and Tabasco sauce. Premade or packaged marinades. Premade or packaged taco seasonings. Relishes. Regular salad dressings. Where to find more information:  National Heart, Lung, and Blood Institute: www.nhlbi.nih.gov  American Heart Association: www.heart.org Summary  The DASH eating plan is a healthy eating plan that has been shown to reduce high blood pressure (hypertension). It may also reduce your risk for type 2 diabetes, heart disease, and stroke.  With the DASH eating plan, you should limit salt (sodium) intake to 2,300 mg a day. If you have hypertension, you may need to reduce your sodium intake to 1,500 mg a day.  When on the DASH eating plan, aim to eat more fresh fruits and vegetables, whole grains, lean proteins, low-fat dairy, and heart-healthy fats.  Work with your health care provider or diet and nutrition specialist (dietitian) to adjust your eating plan to your individual   calorie needs. This information is not intended to replace advice given to you by your health care provider. Make sure you discuss any questions you have with your health care provider. Document Released: 09/20/2011 Document Revised: 09/24/2016 Document Reviewed: 09/24/2016 [employer] Interactive Patient Education  2018 [employer]  

## 2018-09-18 NOTE — Patient Instructions (Signed)
Penny Mathews , Thank you for taking time to come for your Medicare Wellness Visit. I appreciate your ongoing commitment to your health goals. Please review the following plan we discussed and let me know if I can assist you in the future.   Screening recommendations/referrals: Colonoscopy: not required Mammogram: not required Bone Density: 02/2015 Recommended yearly ophthalmology/optometry visit for glaucoma screening and checkup Recommended yearly dental visit for hygiene and checkup  Vaccinations: Influenza vaccine: refused Pneumococcal vaccine: refused Tdap vaccine: 07/2013 Shingles vaccine: refused    Advanced directives: Please bring a copy of your POA (Power of [place]) and/or Living Will to your next appointment.    Conditions/risks identified: Smoking: states not ready to quit at this time  Next appointment: 09/18/2018   Preventive Care 65 Years and Older, Female Preventive care refers to lifestyle choices and visits with your health care provider that can promote health and wellness. What does preventive care include?  A yearly physical exam. This is also called an annual well check.  Dental exams once or twice a year.  Routine eye exams. Ask your health care provider how often you should have your eyes checked.  Personal lifestyle choices, including:  Daily care of your teeth and gums.  Regular physical activity.  Eating a healthy diet.  Avoiding tobacco and drug use.  Limiting alcohol use.  Practicing safe sex.  Taking low-dose aspirin every day.  Taking vitamin and mineral supplements as recommended by your health care provider. What happens during an annual well check? The services and screenings done by your health care provider during your annual well check will depend on your age, overall health, lifestyle risk factors, and family history of disease. Counseling  Your health care provider may ask you questions about your:  Alcohol use.  Tobacco  use.  Drug use.  Emotional well-being.  Home and relationship well-being.  Sexual activity.  Eating habits.  History of falls.  Memory and ability to understand (cognition).  Work and work Statistician.  Reproductive health. Screening  You may have the following tests or measurements:  Height, weight, and BMI.  Blood pressure.  Lipid and cholesterol levels. These may be checked every 5 years, or more frequently if you are over 50 years old.  Skin check.  Lung cancer screening. You may have this screening every year starting at age 9 if you have a 30-pack-year history of smoking and currently smoke or have quit within the past 15 years.  Fecal occult blood test (FOBT) of the stool. You may have this test every year starting at age 79.  Flexible sigmoidoscopy or colonoscopy. You may have a sigmoidoscopy every 5 years or a colonoscopy every 10 years starting at age 9.  Hepatitis C blood test.  Hepatitis B blood test.  Sexually transmitted disease (STD) testing.  Diabetes screening. This is done by checking your blood sugar (glucose) after you have not eaten for a while (fasting). You may have this done every 1-3 years.  Bone density scan. This is done to screen for osteoporosis. You may have this done starting at age 64.  Mammogram. This may be done every 1-2 years. Talk to your health care provider about how often you should have regular mammograms. Talk with your health care provider about your test results, treatment options, and if necessary, the need for more tests. Vaccines  Your health care provider may recommend certain vaccines, such as:  Influenza vaccine. This is recommended every year.  Tetanus, diphtheria, and acellular pertussis (Tdap,  Td) vaccine. You may need a Td booster every 10 years.  Zoster vaccine. You may need this after age 28.  Pneumococcal 13-valent conjugate (PCV13) vaccine. One dose is recommended after age 53.  Pneumococcal  polysaccharide (PPSV23) vaccine. One dose is recommended after age 78. Talk to your health care provider about which screenings and vaccines you need and how often you need them. This information is not intended to replace advice given to you by your health care provider. Make sure you discuss any questions you have with your health care provider. Document Released: 10/28/2015 Document Revised: 06/20/2016 Document Reviewed: 08/02/2015 Elsevier Interactive Patient Education  2017 [place] Prevention in the Home Falls can cause injuries. They can happen to people of all ages. There are many things you can do to make your home safe and to help prevent falls. What can I do on the outside of my home?  Regularly fix the edges of walkways and driveways and fix any cracks.  Remove anything that might make you trip as you walk through a door, such as a raised step or threshold.  Trim any bushes or trees on the path to your home.  Use bright outdoor lighting.  Clear any walking paths of anything that might make someone trip, such as rocks or tools.  Regularly check to see if handrails are loose or broken. Make sure that both sides of any steps have handrails.  Any raised decks and porches should have guardrails on the edges.  Have any leaves, snow, or ice cleared regularly.  Use sand or salt on walking paths during winter.  Clean up any spills in your garage right away. This includes oil or grease spills. What can I do in the bathroom?  Use night lights.  Install grab bars by the toilet and in the tub and shower. Do not use towel bars as grab bars.  Use non-skid mats or decals in the tub or shower.  If you need to sit down in the shower, use a plastic, non-slip stool.  Keep the floor dry. Clean up any water that spills on the floor as soon as it happens.  Remove soap buildup in the tub or shower regularly.  Attach bath mats securely with double-sided non-slip rug  tape.  Do not have throw rugs and other things on the floor that can make you trip. What can I do in the bedroom?  Use night lights.  Make sure that you have a light by your bed that is easy to reach.  Do not use any sheets or blankets that are too big for your bed. They should not hang down onto the floor.  Have a firm chair that has side arms. You can use this for support while you get dressed.  Do not have throw rugs and other things on the floor that can make you trip. What can I do in the kitchen?  Clean up any spills right away.  Avoid walking on wet floors.  Keep items that you use a lot in easy-to-reach places.  If you need to reach something above you, use a strong step stool that has a grab bar.  Keep electrical cords out of the way.  Do not use floor polish or wax that makes floors slippery. If you must use wax, use non-skid floor wax.  Do not have throw rugs and other things on the floor that can make you trip. What can I do with my stairs?  Do not  leave any items on the stairs.  Make sure that there are handrails on both sides of the stairs and use them. Fix handrails that are broken or loose. Make sure that handrails are as long as the stairways.  Check any carpeting to make sure that it is firmly attached to the stairs. Fix any carpet that is loose or worn.  Avoid having throw rugs at the top or bottom of the stairs. If you do have throw rugs, attach them to the floor with carpet tape.  Make sure that you have a light switch at the top of the stairs and the bottom of the stairs. If you do not have them, ask someone to add them for you. What else can I do to help prevent falls?  Wear shoes that:  Do not have high heels.  Have rubber bottoms.  Are comfortable and fit you well.  Are closed at the toe. Do not wear sandals.  If you use a stepladder:  Make sure that it is fully opened. Do not climb a closed stepladder.  Make sure that both sides of the  stepladder are locked into place.  Ask someone to hold it for you, if possible.  Clearly mark and make sure that you can see:  Any grab bars or handrails.  First and last steps.  Where the edge of each step is.  Use tools that help you move around (mobility aids) if they are needed. These include:  Canes.  Walkers.  Scooters.  Crutches.  Turn on the lights when you go into a dark area. Replace any light bulbs as soon as they burn out.  Set up your furniture so you have a clear path. Avoid moving your furniture around.  If any of your floors are uneven, fix them.  If there are any pets around you, be aware of where they are.  Review your medicines with your doctor. Some medicines can make you feel dizzy. This can increase your chance of falling. Ask your doctor what other things that you can do to help prevent falls. This information is not intended to replace advice given to you by your health care provider. Make sure you discuss any questions you have with your health care provider. Document Released: 07/28/2009 Document Revised: 03/08/2016 Document Reviewed: 11/05/2014 Elsevier Interactive Patient Education  2017 [employer].

## 2018-09-18 NOTE — Progress Notes (Signed)
Subjective:   Penny Mathews is a 84 y.o. female who presents for Medicare Annual (Subsequent) preventive examination.  Review of Systems:  n/a Cardiac Risk Factors include: advanced age (>23men, >74 women);diabetes mellitus;hypertension;smoking/ tobacco exposure     Objective:     Vitals: BP (!) 142/96 (Patient Position: Sitting)   Pulse 76   Temp 97.8 F (36.6 C) (Oral)   Ht 5\' 6"  (1.676 m)   Wt 173 lb 6.4 oz (78.7 kg)   BMI 27.99 kg/m   Body mass index is 27.99 kg/m.  Advanced Directives 09/18/2018 11/08/2013  Does Patient Have a Medical Advance Directive? Yes Patient does not have advance directive  Type of Advance Directive [place];Living will -  Does patient want to make changes to medical advance directive? No - Patient declined -  Copy of [place] in Chart? No - copy requested -  Pre-existing out of facility DNR order (yellow form or pink MOST form) - No    Tobacco Social History   Tobacco Use  Smoking Status Current Every Day Smoker  . Packs/day: 0.20  . Types: Cigarettes  Smokeless Tobacco Never Used     Ready to quit: No Counseling given: Not Answered   Clinical Intake:  Pre-visit preparation completed: Yes  Pain : No/denies pain     Nutritional Status: BMI 25 -29 Overweight Nutritional Risks: None Diabetes: Yes CBG done?: No Did pt. bring in CBG monitor from home?: No  How often do you need to have someone help you when you read instructions, pamphlets, or other written materials from your doctor or pharmacy?: 1 - Never What is the last grade level you completed in school?: 12 th grade  Interpreter Needed?: No  Information entered by :: [name]  Past Medical History:  Diagnosis Date  . Cancer ([place])    Breast Ca - chemo & radiation 1990  . Hypertension    Past Surgical History:  Procedure Laterality Date  . ABDOMINAL HYSTERECTOMY    . BREAST LUMPECTOMY    . CERVICAL FUSION    .  Fractured Leg x 2     Family History  Problem Relation Age of Onset  . Heart attack Father   . Lymphoma Sister   . Cancer Sister    Social History   Socioeconomic History  . Marital status: Single    Spouse name: Not on file  . Number of children: Not on file  . Years of education: Not on file  . Highest education level: Not on file  Occupational History  . Occupation: retired  Scientific laboratory technician  . Financial resource strain: Not hard at all  . Food insecurity:    Worry: Never true    Inability: Never true  . Transportation needs:    Medical: No    Non-medical: No  Tobacco Use  . Smoking status: Current Every Day Smoker    Packs/day: 0.20    Types: Cigarettes  . Smokeless tobacco: Never Used  Substance and Sexual Activity  . Alcohol use: Yes    Comment: on occasion  . Drug use: Not Currently  . Sexual activity: Not Currently  Lifestyle  . Physical activity:    Days per week: 0 days    Minutes per session: 0 min  . Stress: Not at all  Relationships  . Social connections:    Talks on phone: Not on file    Gets together: Not on file    Attends religious service: Not  on file    Active member of club or organization: Not on file    Attends meetings of clubs or organizations: Not on file    Relationship status: Not on file  Other Topics Concern  . Not on file  Social History Narrative  . Not on file    Outpatient Encounter Medications as of 09/18/2018  Medication Sig  . amLODipine (NORVASC) 5 MG tablet Take 1 tablet (5 mg total) by mouth daily.  Marland Kitchen aspirin 81 MG tablet Take 1 tablet (81 mg total) by mouth daily.  . pravastatin (PRAVACHOL) 40 MG tablet Take 1 tablet (40 mg total) by mouth daily.  Marland Kitchen telmisartan (MICARDIS) 20 MG tablet Take 1 tablet (20 mg total) by mouth daily.  . [DISCONTINUED] pravastatin (PRAVACHOL) 40 MG tablet Take 40 mg by mouth daily.  . [DISCONTINUED] telmisartan (MICARDIS) 20 MG tablet    No facility-administered encounter medications on file  as of 09/18/2018.     Activities of Daily Living In your present state of health, do you have any difficulty performing the following activities: 09/18/2018  Hearing? N  Vision? N  Difficulty concentrating or making decisions? N  Walking or climbing stairs? N  Dressing or bathing? N  Doing errands, shopping? N  Preparing Food and eating ? N  Using the Toilet? N  In the past six months, have you accidently leaked urine? N  Do you have problems with loss of bowel control? N  Managing your Medications? N  Managing your Finances? N  Housekeeping or managing your Housekeeping? N  Some recent data might be hidden    Patient Care Team: Glendale Chard, MD as PCP - General (Internal Medicine)    Assessment:   This is a routine wellness examination for Penny Mathews.  Exercise Activities and Dietary recommendations Current Exercise Habits: The patient does not participate in regular exercise at present, Exercise limited by: None identified  Goals   None     Fall Risk Fall Risk  09/18/2018  Falls in the past year? 0  Risk for fall due to : Medication side effect   Is the patient's home free of loose throw rugs in walkways, pet beds, electrical cords, etc?   yes      Grab bars in the bathroom? no      Handrails on the stairs?   yes      Adequate lighting?   yes  Timed Get Up and Go performed: n/a  Depression Screen PHQ 2/9 Scores 09/18/2018  PHQ - 2 Score 0  PHQ- 9 Score 0     Cognitive Function     6CIT Screen 09/18/2018  What Year? 0 points  What month? 0 points  What time? 0 points  Count back from 20 0 points  Months in reverse 2 points  Repeat phrase 2 points  Total Score 4    Immunization History  Administered Date(s) Administered  . Td 11/06/2005    Qualifies for Shingles Vaccine? yes  Screening Tests Health Maintenance  Topic Date Due  . INFLUENZA VACCINE  09/18/2019 (Originally 05/15/2018)  . TETANUS/TDAP  09/19/2019 (Originally [birth year]19/1955)  . PNA vac Low  Risk Adult (1 of 2 - PCV13) 09/19/2019 (Originally 08/18/2001)  . DEXA SCAN  Discontinued    Cancer Screenings: Lung: Low Dose CT Chest recommended if Age 43-80 years, 30 pack-year currently smoking OR have quit w/in 15years. Patient does not qualify. Breast:  Up to date on Mammogram? Yes   Up to date of Bone Density/Dexa?  Yes Colorectal: not required  Additional Screenings: : Hepatitis C Screening: n/a     Plan:    Patient refuses vaccinations. Currently smokes a pack of cigarettes a week. She does not want to quit at this time.   I have personally reviewed and noted the following in the patient's chart:   . Medical and social history . Use of alcohol, tobacco or illicit drugs  . Current medications and supplements . Functional ability and status . Nutritional status . Physical activity . Advanced directives . List of other physicians . Hospitalizations, surgeries, and ER visits in previous 12 months . Vitals . Screenings to include cognitive, depression, and falls . Referrals and appointments  In addition, I have reviewed and discussed with patient certain preventive protocols, quality metrics, and best practice recommendations. A written personalized care plan for preventive services as well as general preventive health recommendations were provided to patient.     Kellie Simmering, LPN  00/06/2329

## 2018-09-21 ENCOUNTER — Encounter: Payer: Self-pay | Admitting: Internal Medicine

## 2018-09-21 DIAGNOSIS — E7800 Pure hypercholesterolemia, unspecified: Secondary | ICD-10-CM | POA: Insufficient documentation

## 2018-09-21 DIAGNOSIS — I129 Hypertensive chronic kidney disease with stage 1 through stage 4 chronic kidney disease, or unspecified chronic kidney disease: Secondary | ICD-10-CM | POA: Insufficient documentation

## 2018-09-21 DIAGNOSIS — N182 Chronic kidney disease, stage 2 (mild): Secondary | ICD-10-CM | POA: Insufficient documentation

## 2018-09-21 DIAGNOSIS — R7309 Other abnormal glucose: Secondary | ICD-10-CM | POA: Insufficient documentation

## 2018-09-21 NOTE — Progress Notes (Signed)
Subjective:     Patient ID: Penny Mathews , female    DOB: 07-Jul-1934 , 84 y.o.   MRN: [id number]   Chief Complaint  Patient presents with  . Hypertension    HPI  Hypertension  This is a chronic problem. The current episode started more than 1 year ago. The problem has been gradually improving since onset. The problem is controlled. Pertinent negatives include no blurred vision, chest pain, headaches, palpitations or shortness of breath. Risk factors for coronary artery disease include dyslipidemia, post-menopausal state and sedentary lifestyle. The current treatment provides moderate improvement. Hypertensive end-organ damage includes kidney disease.   She reports compliance with meds.   Past Medical History:  Diagnosis Date  . Cancer ([place])    Breast Ca - chemo & radiation 1990  . Hypertension      Family History  Problem Relation Age of Onset  . Heart attack Father   . Lymphoma Sister   . Cancer Sister      Current Outpatient Medications:  .  amLODipine (NORVASC) 5 MG tablet, Take 1 tablet (5 mg total) by mouth daily., Disp: 90 tablet, Rfl: 2 .  aspirin 81 MG tablet, Take 1 tablet (81 mg total) by mouth daily., Disp: 30 tablet, Rfl:  .  pravastatin (PRAVACHOL) 40 MG tablet, Take 1 tablet (40 mg total) by mouth daily., Disp: 90 tablet, Rfl: 2 .  telmisartan (MICARDIS) 20 MG tablet, Take 1 tablet (20 mg total) by mouth daily., Disp: 90 tablet, Rfl: 2   No Known Allergies   Review of Systems  Constitutional: Negative.   Eyes: Negative for blurred vision.  Respiratory: Negative.  Negative for shortness of breath.   Cardiovascular: Negative.  Negative for chest pain and palpitations.  Gastrointestinal: Negative.   Genitourinary: Negative.   Neurological: Negative.  Negative for headaches.  Psychiatric/Behavioral: Negative.      Today's Vitals   09/18/18 0902  BP: (!) 142/96  Pulse: 76  Temp: 97.8 F (36.6 C)  TempSrc: Oral  Weight: 173 lb 6.4 oz (78.7 kg)   Height: _0  (1.702 m)  PainSc: 0-No pain   Body mass index is 27.16 kg/m.   Objective:  Physical Exam  Constitutional: She is oriented to person, place, and time. She appears well-developed and well-nourished.  HENT:  Head: Normocephalic and atraumatic.  Eyes: EOM are normal.  Cardiovascular: Normal rate, regular rhythm and normal heart sounds.  Pulmonary/Chest: Effort normal and breath sounds normal.  Neurological: She is alert and oriented to person, place, and time.  Skin: Skin is warm and dry.  Psychiatric: She has a normal mood and affect.  Nursing note and vitals reviewed.       Assessment And Plan:     1. Hypertensive nephropathy  Fair control. She wishes to continue with current meds. She is encouraged to avoid adding salt to her foods. She is encouraged to also incorporate more exercise into her daily routine. She will rto in four months for re-evaluation.   - CMP14+EGFR - CBC no Diff  2. Chronic renal disease, stage II  Chronic. I will check Gfr, cr today. She is encouraged to stay well hydrated.   3. Other abnormal glucose  HER A1C HAS BEEN ELEVATED IN THE PAST. I WILL CHECK AN A1C, BMET TODAY. SHE WAS ENCOURAGED TO AVOID SUGARY BEVERAGES AND PROCESSED FOODS INCLUDNG BREADS, RICE AND PASTA.  - Hemoglobin A1c - POCT Urinalysis Dipstick (81002) - POCT UA - Microalbumin  4. Pure hypercholesterolemia  SHE IS  ENCOURAGED TO EXERCISE AT LEAST FIVE DAYS WEEKLY FOR AT LEAST 30 MINUTES, AND TO EAT FISH AT LEAST TWICE WEEKLY WHICH WILL HELP TO RAISE YOUR HDL.  BEST CHOICES ARE SARDINES, SALMON AND TUNA. I WILL REASSESS IN SIX MONTHS OR SO.    Maximino Greenland, MD

## 2018-09-21 NOTE — Progress Notes (Signed)
Your liver and kidney fxn are stable. Your blood count is normal. Your hba1c is 6.1, not bad! pls exercise no less than four days weekly for 30 minutes. Avoid sugary beverages.

## 2018-09-26 ENCOUNTER — Other Ambulatory Visit: Payer: Self-pay

## 2018-12-18 ENCOUNTER — Encounter: Payer: Self-pay | Admitting: Internal Medicine

## 2018-12-18 ENCOUNTER — Ambulatory Visit (INDEPENDENT_AMBULATORY_CARE_PROVIDER_SITE_OTHER): Payer: Medicare HMO | Admitting: Internal Medicine

## 2018-12-18 VITALS — BP 128/92 | HR 62 | Temp 98.2°F | Ht 64.6 in | Wt 171.8 lb

## 2018-12-18 DIAGNOSIS — R7309 Other abnormal glucose: Secondary | ICD-10-CM

## 2018-12-18 DIAGNOSIS — E7800 Pure hypercholesterolemia, unspecified: Secondary | ICD-10-CM

## 2018-12-18 DIAGNOSIS — I129 Hypertensive chronic kidney disease with stage 1 through stage 4 chronic kidney disease, or unspecified chronic kidney disease: Secondary | ICD-10-CM | POA: Diagnosis not present

## 2018-12-18 DIAGNOSIS — N182 Chronic kidney disease, stage 2 (mild): Secondary | ICD-10-CM

## 2018-12-18 DIAGNOSIS — E663 Overweight: Secondary | ICD-10-CM | POA: Diagnosis not present

## 2018-12-18 DIAGNOSIS — Z6828 Body mass index (BMI) 28.0-28.9, adult: Secondary | ICD-10-CM

## 2018-12-18 NOTE — Progress Notes (Signed)
Subjective:     Patient ID: Penny Mathews , female    DOB: December 29, 1933 , 85 y.o.   MRN: [id number]   Chief Complaint  Patient presents with  . Hypertension    HPI  Hypertension  This is a chronic problem. The current episode started more than 1 year ago. The problem has been gradually improving since onset. The problem is controlled. Pertinent negatives include no blurred vision, chest pain, palpitations or shortness of breath. Risk factors for coronary artery disease include dyslipidemia and post-menopausal state.  She reports compliance with meds. States she is going to visit her cousin in hospice after this visit. He has CHF.    Past Medical History:  Diagnosis Date  . Cancer ([place])    Breast Ca - chemo & radiation 1990  . Hypertension      Family History  Problem Relation Age of Onset  . Healthy Mother   . Heart attack Father   . Lymphoma Sister   . Cancer Sister      Current Outpatient Medications:  .  amLODipine (NORVASC) 5 MG tablet, Take 1 tablet (5 mg total) by mouth daily., Disp: 90 tablet, Rfl: 2 .  aspirin 81 MG tablet, Take 1 tablet (81 mg total) by mouth daily., Disp: 30 tablet, Rfl:  .  pravastatin (PRAVACHOL) 40 MG tablet, Take 1 tablet (40 mg total) by mouth daily., Disp: 90 tablet, Rfl: 2 .  telmisartan (MICARDIS) 20 MG tablet, Take 1 tablet (20 mg total) by mouth daily., Disp: 90 tablet, Rfl: 2   No Known Allergies   Review of Systems  Constitutional: Negative.   Eyes: Negative for blurred vision.  Respiratory: Negative.  Negative for shortness of breath.   Cardiovascular: Negative.  Negative for chest pain and palpitations.  Gastrointestinal: Negative.   Neurological: Negative.   Psychiatric/Behavioral: Negative.      Today's Vitals   12/18/18 0839  BP: (!) 128/92  Pulse: 62  Temp: 98.2 F (36.8 C)  TempSrc: Oral  Weight: 171 lb 12.8 oz (77.9 kg)  Height: 5' 4.6" (1.641 m)  PainSc: 0-No pain   Body mass index is 28.94 kg/m.    Objective:  Physical Exam Vitals signs and nursing note reviewed.  Constitutional:      Appearance: Normal appearance.  HENT:     Head: Normocephalic and atraumatic.  Cardiovascular:     Rate and Rhythm: Normal rate and regular rhythm.     Heart sounds: Normal heart sounds.  Pulmonary:     Effort: Pulmonary effort is normal.     Breath sounds: Normal breath sounds.  Skin:    General: Skin is warm.  Neurological:     General: No focal deficit present.     Mental Status: She is alert.  Psychiatric:        Mood and Affect: Mood normal.        Behavior: Behavior normal.         Assessment And Plan:     1. Hypertensive nephropathy  Fair control. She will continue with current meds for now. She is encouraged to avoid adding salt to her foods. She will rto in 5 months for re-evaluation.  - CMP14+EGFR - Lipid panel  2. Chronic renal disease, stage II  Chronic. She is encouraged to stay well hydrated. I will check a GFR, Cr today.   3. Other abnormal glucose  HER A1C HAS BEEN ELEVATED IN THE PAST. I WILL CHECK AN A1C, BMET TODAY. SHE WAS ENCOURAGED TO  AVOID SUGARY BEVERAGES AND PROCESSED FOODS INCLUDNG BREADS, RICE AND PASTA.  - Hemoglobin A1c  4. Pure hypercholesterolemia  She is encouraged to avoid fried foods and to incorporate more exercise into your daily routine.   5. Body mass index (BMI) of 28.0 to 28.9 in adult  Her weight is stable.   6. Overweight   Maximino Greenland, MD

## 2018-12-18 NOTE — Patient Instructions (Signed)

## 2018-12-19 LAB — CMP14+EGFR
ALBUMIN: 4.5 g/dL (ref 3.6–4.6)
ALK PHOS: 93 IU/L (ref 39–117)
ALT: 12 IU/L (ref 0–32)
AST: 22 IU/L (ref 0–40)
Albumin/Globulin Ratio: 1.9 (ref 1.2–2.2)
BUN/Creatinine Ratio: 9 — ABNORMAL LOW (ref 12–28)
BUN: 9 mg/dL (ref 8–27)
Bilirubin Total: 0.5 mg/dL (ref 0.0–1.2)
CO2: 24 mmol/L (ref 20–29)
CREATININE: 0.99 mg/dL (ref 0.57–1.00)
Calcium: 9.8 mg/dL (ref 8.7–10.3)
Chloride: 102 mmol/L (ref 96–106)
GFR calc Af Amer: 61 mL/min/{1.73_m2} (ref >59)
GFR calc non Af Amer: 53 mL/min/{1.73_m2} — ABNORMAL LOW (ref >59)
GLUCOSE: 105 mg/dL — AB (ref 65–99)
Globulin, Total: 2.4 g/dL (ref 1.5–4.5)
Potassium: 5 mmol/L (ref 3.5–5.2)
Sodium: 140 mmol/L (ref 134–144)
Total Protein: 6.9 g/dL (ref 6.0–8.5)

## 2018-12-19 LAB — LIPID PANEL
CHOL/HDL RATIO: 3.2 ratio (ref 0.0–4.4)
CHOLESTEROL TOTAL: 264 mg/dL — AB (ref 100–199)
HDL: 82 mg/dL (ref >39)
LDL Calculated: 152 mg/dL — ABNORMAL HIGH (ref 0–99)
TRIGLYCERIDES: 149 mg/dL (ref 0–149)
VLDL Cholesterol Cal: 30 mg/dL (ref 5–40)

## 2018-12-19 LAB — HEMOGLOBIN A1C
ESTIMATED AVERAGE GLUCOSE: 134 mg/dL
HEMOGLOBIN A1C: 6.3 % — AB (ref 4.8–5.6)

## 2018-12-26 ENCOUNTER — Telehealth: Payer: Self-pay

## 2018-12-26 NOTE — Telephone Encounter (Signed)
Left the patient a message to call back for lab results. 

## 2018-12-26 NOTE — Telephone Encounter (Signed)
-----   Message from Glendale Chard, MD sent at 12/19/2018  2:19 PM EST ----- Your liver and kidney fxn are stable. Be sure to stay well hydrated. a1c is 6.3, this is pretty good. Goal is to get less than 5.6, into normal range. Your LDL, bad chol is 152. Are you taking chol meds every day? If yes, I need to increase dose to 80mg  daily M-F and you will be able to skip the weekends. Are you willing to make this change? If yes, needs to return in 4-6 weeks for f/u with me. Can take 2 pills daily until she runs out. Okay to send rx 80mg  #30/1 refill.

## 2018-12-30 ENCOUNTER — Other Ambulatory Visit: Payer: Self-pay

## 2018-12-30 MED ORDER — PRAVASTATIN SODIUM 40 MG PO TABS: 40.0000 mg | ORAL_TABLET | Freq: Every day | ORAL | 2 refills | Status: DC

## 2019-02-17 DIAGNOSIS — H2513 Age-related nuclear cataract, bilateral: Secondary | ICD-10-CM | POA: Diagnosis not present

## 2019-05-21 ENCOUNTER — Other Ambulatory Visit: Payer: Self-pay

## 2019-05-21 ENCOUNTER — Encounter: Payer: Self-pay | Admitting: Internal Medicine

## 2019-05-21 ENCOUNTER — Ambulatory Visit (INDEPENDENT_AMBULATORY_CARE_PROVIDER_SITE_OTHER): Payer: Medicare HMO | Admitting: Internal Medicine

## 2019-05-21 VITALS — BP 142/80 | HR 63 | Temp 97.7°F | Ht 64.6 in | Wt 164.5 lb

## 2019-05-21 DIAGNOSIS — R7309 Other abnormal glucose: Secondary | ICD-10-CM | POA: Diagnosis not present

## 2019-05-21 DIAGNOSIS — N182 Chronic kidney disease, stage 2 (mild): Secondary | ICD-10-CM

## 2019-05-21 DIAGNOSIS — E7800 Pure hypercholesterolemia, unspecified: Secondary | ICD-10-CM | POA: Diagnosis not present

## 2019-05-21 DIAGNOSIS — I129 Hypertensive chronic kidney disease with stage 1 through stage 4 chronic kidney disease, or unspecified chronic kidney disease: Secondary | ICD-10-CM | POA: Diagnosis not present

## 2019-05-21 DIAGNOSIS — K5900 Constipation, unspecified: Secondary | ICD-10-CM | POA: Diagnosis not present

## 2019-05-21 DIAGNOSIS — Z6827 Body mass index (BMI) 27.0-27.9, adult: Secondary | ICD-10-CM | POA: Diagnosis not present

## 2019-05-21 MED ORDER — LINACLOTIDE 72 MCG PO CAPS: 72.0000 ug | ORAL_CAPSULE | Freq: Every day | ORAL | 1 refills | Status: DC

## 2019-05-21 NOTE — Patient Instructions (Signed)
Start Linzess, 72mg  once daily to help with constipation.   Call Dr. Baird Cancer in 2 weeks to let her know how this is working for her.   Constipation, Adult Constipation is when a person:  Poops (has a bowel movement) fewer times in a week than normal.  Has a hard time pooping.  Has poop that is dry, hard, or bigger than normal. Follow these instructions at home: Eating and drinking   Eat foods that have a lot of fiber, such as: ? Fresh fruits and vegetables. ? Whole grains. ? Beans.  Eat less of foods that are high in fat, low in fiber, or overly processed, such as: ? [place] fries. ? Hamburgers. ? Cookies. ? Candy. ? Soda.  Drink enough fluid to keep your pee (urine) clear or pale yellow. General instructions  Exercise regularly or as told by your doctor.  Go to the restroom when you feel like you need to poop. Do not hold it in.  Take over-the-counter and prescription medicines only as told by your doctor. These include any fiber supplements.  Do pelvic floor retraining exercises, such as: ? Doing deep breathing while relaxing your lower belly (abdomen). ? Relaxing your pelvic floor while pooping.  Watch your condition for any changes.  Keep all follow-up visits as told by your doctor. This is important. Contact a doctor if:  You have pain that gets worse.  You have a fever.  You have not pooped for 4 days.  You throw up (vomit).  You are not hungry.  You lose weight.  You are bleeding from the anus.  You have thin, pencil-like poop (stool). Get help right away if:  You have a fever, and your symptoms suddenly get worse.  You leak poop or have blood in your poop.  Your belly feels hard or bigger than normal (is bloated).  You have very bad belly pain.  You feel dizzy or you faint. This information is not intended to replace advice given to you by your health care provider. Make sure you discuss any questions you have with your health care  provider. Document Released: 03/19/2008 Document Revised: 09/13/2017 Document Reviewed: 03/21/2016 Elsevier Patient Education  2020 [employer].

## 2019-05-21 NOTE — Progress Notes (Signed)
Subjective:     Patient ID: Penny Mathews , female    DOB: 24-Aug-1934 , 85 y.o.   MRN: [id number]   Chief Complaint  Patient presents with  . Hypertension  . Hyperlipidemia    HPI  She is here today for bp/chol check. She reports compliance with meds. States she is doing better with chol meds. Now taking four times per week.   Hypertension This is a chronic problem. The current episode started more than 1 year ago. The problem has been gradually improving since onset. The problem is controlled. Pertinent negatives include no blurred vision, chest pain, palpitations or shortness of breath. Risk factors for coronary artery disease include dyslipidemia and post-menopausal state. The current treatment provides moderate improvement. Compliance problems include exercise.  Identifiable causes of hypertension include chronic renal disease.  Hyperlipidemia This is a chronic problem. The problem is controlled. Exacerbating diseases include chronic renal disease. Pertinent negatives include no chest pain or shortness of breath. The current treatment provides moderate improvement of lipids. Compliance problems include adherence to exercise.  Risk factors for coronary artery disease include hypertension and a sedentary lifestyle.     Past Medical History:  Diagnosis Date  . Cancer ([place])    Breast Ca - chemo & radiation 1990  . Hypertension      Family History  Problem Relation Age of Onset  . Healthy Mother   . Heart attack Father   . Lymphoma Sister   . Cancer Sister      Current Outpatient Medications:  .  amLODipine (NORVASC) 5 MG tablet, Take 1 tablet (5 mg total) by mouth daily., Disp: 90 tablet, Rfl: 2 .  aspirin 81 MG tablet, Take 1 tablet (81 mg total) by mouth daily., Disp: 30 tablet, Rfl:  .  pravastatin (PRAVACHOL) 40 MG tablet, Take 1 tablet (40 mg total) by mouth daily., Disp: 90 tablet, Rfl: 2 .  telmisartan (MICARDIS) 20 MG tablet, Take 1 tablet (20 mg total) by mouth  daily., Disp: 90 tablet, Rfl: 2 .  linaclotide (LINZESS) 72 MCG capsule, Take 1 capsule (72 mcg total) by mouth daily before breakfast., Disp: 30 capsule, Rfl: 1   No Known Allergies   Review of Systems  Constitutional: Negative.   Eyes: Negative for blurred vision.  Respiratory: Negative.  Negative for shortness of breath.   Cardiovascular: Negative.  Negative for chest pain and palpitations.  Gastrointestinal: Positive for constipation.       She reports being more constipated than usual. Admits she does not do much. She goes to [employer] once weekly for groceries and cuts her lawn once weekly. She does not walk in neighborhood b/c she is afraid of dogs. She reports she drinks plenty of water and eats fruits/veggies. She has tried Miralax w/o any good results.   Neurological: Negative.   Psychiatric/Behavioral: Negative.      Today's Vitals   05/21/19 0842  BP: (!) 142/80  Pulse: 63  Temp: 97.7 F (36.5 C)  TempSrc: Oral  Weight: 164 lb 8 oz (74.6 kg)  Height: 5' 4.6" (1.641 m)   Body mass index is 27.71 kg/m.   Objective:  Physical Exam Vitals signs and nursing note reviewed.  Constitutional:      Appearance: Normal appearance.  HENT:     Head: Normocephalic and atraumatic.  Cardiovascular:     Rate and Rhythm: Normal rate and regular rhythm.     Heart sounds: Normal heart sounds.  Pulmonary:     Effort: Pulmonary effort  is normal.     Breath sounds: Normal breath sounds.  Abdominal:     General: Abdomen is flat. Bowel sounds are normal.  Skin:    General: Skin is warm.  Neurological:     General: No focal deficit present.     Mental Status: She is alert.  Psychiatric:        Mood and Affect: Mood normal.        Behavior: Behavior normal.         Assessment And Plan:     1. Hypertensive nephropathy  Chronic, fair control. She will continue with current meds. Reports her bp is always high when she comes to the doctor. She is also encouraged to limit her  salt intake.   - CMP14+EGFR  2. Chronic renal disease, stage II  Chronic, I will check GFR, Cr today. She is encouraged to stay well hydrated.   3. Other abnormal glucose  HER A1C HAS BEEN ELEVATED IN THE PAST. I WILL CHECK AN A1C, BMET TODAY. SHE WAS ENCOURAGED TO AVOID SUGARY BEVERAGES AND PROCESSED FOODS INCLUDNG BREADS, RICE AND PASTA.  - Hemoglobin A1c  4. Pure hypercholesterolemia  Chronic. She is encouraged to continue with dosing four times per week. I will check fasting lipid panel at her next visit, Dec 2020.  5. Constipation, unspecified constipation type  She is encouraged to increase her daily activity level. She states she will start doing exercise classes on TV. She was given samples of Linzess, 14m to take upon awakening daily. She is encouraged to contact me in 1-2 weeks to let me know how she is doing.   6. Body mass index (BMI) of 27.0 to 27.9 in adult  Importance of achieving optimal weight to decrease risk of cardiovascular disease and cancers was discussed with the patient in full detail. She is encouraged to start slowly - start with 10 minutes twice daily at least three to four days per week and to gradually build to 30 minutes five days weekly. She was given tips to incorporate more activity into her daily routine - take stairs when possible, park farther away from grocery stores, etc.    RMaximino Greenland MD    THE PATIENT IS ENCOURAGED TO PRACTICE SOCIAL DISTANCING DUE TO THE COVID-19 PANDEMIC.

## 2019-05-22 LAB — CMP14+EGFR
ALT: 10 IU/L (ref 0–32)
AST: 16 IU/L (ref 0–40)
Albumin/Globulin Ratio: 2.2 (ref 1.2–2.2)
Albumin: 4.4 g/dL (ref 3.6–4.6)
Alkaline Phosphatase: 90 IU/L (ref 39–117)
BUN/Creatinine Ratio: 7 — ABNORMAL LOW (ref 12–28)
BUN: 8 mg/dL (ref 8–27)
Bilirubin Total: 0.5 mg/dL (ref 0.0–1.2)
CO2: 24 mmol/L (ref 20–29)
Calcium: 9.4 mg/dL (ref 8.7–10.3)
Chloride: 100 mmol/L (ref 96–106)
Creatinine, Ser: 1.07 mg/dL — ABNORMAL HIGH (ref 0.57–1.00)
GFR calc Af Amer: 56 mL/min/{1.73_m2} — ABNORMAL LOW (ref >59)
GFR calc non Af Amer: 48 mL/min/{1.73_m2} — ABNORMAL LOW (ref >59)
Globulin, Total: 2 g/dL (ref 1.5–4.5)
Glucose: 108 mg/dL — ABNORMAL HIGH (ref 65–99)
Potassium: 4.1 mmol/L (ref 3.5–5.2)
Sodium: 142 mmol/L (ref 134–144)
Total Protein: 6.4 g/dL (ref 6.0–8.5)

## 2019-05-22 LAB — HEMOGLOBIN A1C
Est. average glucose Bld gHb Est-mCnc: 128 mg/dL
Hgb A1c MFr Bld: 6.1 % — ABNORMAL HIGH (ref 4.8–5.6)

## 2019-06-30 ENCOUNTER — Other Ambulatory Visit: Payer: Self-pay

## 2019-06-30 ENCOUNTER — Ambulatory Visit (INDEPENDENT_AMBULATORY_CARE_PROVIDER_SITE_OTHER): Payer: Medicare HMO

## 2019-06-30 VITALS — BP 142/72 | HR 64 | Temp 98.1°F | Ht 64.4 in | Wt 165.6 lb

## 2019-06-30 DIAGNOSIS — Z0000 Encounter for general adult medical examination without abnormal findings: Secondary | ICD-10-CM | POA: Diagnosis not present

## 2019-06-30 NOTE — Progress Notes (Signed)
Subjective:   [name] is a 85 y.o. female who presents for Medicare Annual (Subsequent) preventive examination.  Review of Systems:  n/a Cardiac Risk Factors include: hypertension     Objective:     Vitals: BP (!) 142/72 (BP Location: Right Arm, Patient Position: Sitting, Cuff Size: Normal)   Pulse 64   Temp 98.1 F (36.7 C) (Oral)   Ht 5' 4.4" (1.636 m)   Wt 165 lb 9.6 oz (75.1 kg)   SpO2 97%   BMI 28.07 kg/m   Body mass index is 28.07 kg/m.  Advanced Directives 06/30/2019 09/18/2018 11/08/2013  Does Patient Have a Medical Advance Directive? Yes Yes Patient does not have advance directive  Type of Advance Directive [name];Living will [name];Living will -  Does patient want to make changes to medical advance directive? - No - Patient declined -  Copy of [name] in Chart? No - copy requested No - copy requested -  Pre-existing out of facility DNR order (yellow form or pink MOST form) - - No    Tobacco Social History   Tobacco Use  Smoking Status Current Every Day Smoker  . Packs/day: 0.12  . Years: 50.00  . Pack years: 6.00  . Types: Cigarettes  Smokeless Tobacco Never Used     Ready to quit: No Counseling given: Not Answered   Clinical Intake:  Pre-visit preparation completed: Yes  Pain : 0-10 Pain Score: 5  Pain Type: Chronic pain Pain Location: Shoulder Pain Orientation: Right Pain Descriptors / Indicators: Sore Pain Onset: More than a month ago Pain Frequency: Intermittent Pain Relieving Factors: rest helps  Pain Relieving Factors: rest helps  Nutritional Status: BMI 25 -29 Overweight Nutritional Risks: None Diabetes: No  How often do you need to have someone help you when you read instructions, pamphlets, or other written materials from your doctor or pharmacy?: 1 - Never What is the last grade level you completed in school?: 12th grade  Interpreter Needed?: No   Information entered by :: [name]  Past Medical History:  Diagnosis Date  . Cancer ([place])    Breast Ca - chemo & radiation 1990  . Hypertension    Past Surgical History:  Procedure Laterality Date  . ABDOMINAL HYSTERECTOMY    . BREAST LUMPECTOMY    . CERVICAL FUSION    . Fractured Leg x 2     Family History  Problem Relation Age of Onset  . Healthy Mother   . Heart attack Father   . Lymphoma Sister   . Cancer Sister    Social History   Socioeconomic History  . Marital status: Single    Spouse name: Not on file  . Number of children: Not on file  . Years of education: Not on file  . Highest education level: Not on file  Occupational History  . Occupation: retired  Scientific laboratory technician  . Financial resource strain: Not hard at all  . Food insecurity    Worry: Never true    Inability: Never true  . Transportation needs    Medical: No    Non-medical: No  Tobacco Use  . Smoking status: Current Every Day Smoker    Packs/day: 0.12    Years: 50.00    Pack years: 6.00    Types: Cigarettes  . Smokeless tobacco: Never Used  Substance and Sexual Activity  . Alcohol use: Yes    Comment: on occasion  . Drug use: Not  Currently  . Sexual activity: Not Currently  Lifestyle  . Physical activity    Days per week: 2 days    Minutes per session: 30 min  . Stress: Not at all  Relationships  . Social Herbalist on phone: Not on file    Gets together: Not on file    Attends religious service: Not on file    Active member of club or organization: Not on file    Attends meetings of clubs or organizations: Not on file    Relationship status: Not on file  Other Topics Concern  . Not on file  Social History Narrative  . Not on file    Outpatient Encounter Medications as of 06/30/2019  Medication Sig  . amLODipine (NORVASC) 5 MG tablet Take 1 tablet (5 mg total) by mouth daily.  Marland Kitchen aspirin 81 MG tablet Take 1 tablet (81 mg total) by mouth daily.  Marland Kitchen linaclotide  (LINZESS) 72 MCG capsule Take 1 capsule (72 mcg total) by mouth daily before breakfast.  . pravastatin (PRAVACHOL) 40 MG tablet Take 1 tablet (40 mg total) by mouth daily.  Marland Kitchen telmisartan (MICARDIS) 20 MG tablet Take 1 tablet (20 mg total) by mouth daily.   No facility-administered encounter medications on file as of 06/30/2019.     Activities of Daily Living In your present state of health, do you have any difficulty performing the following activities: 06/30/2019 09/18/2018  Hearing? N N  Vision? N N  Difficulty concentrating or making decisions? N N  Walking or climbing stairs? N N  Dressing or bathing? N N  Doing errands, shopping? N N  Preparing Food and eating ? N N  Using the Toilet? N N  In the past six months, have you accidently leaked urine? N N  Do you have problems with loss of bowel control? N N  Managing your Medications? N N  Managing your Finances? N N  Housekeeping or managing your Housekeeping? N N  Some recent data might be hidden    Patient Care Team: Glendale Chard, MD as PCP - General (Internal Medicine)    Assessment:   This is a routine wellness examination for [name].  Exercise Activities and Dietary recommendations Current Exercise Habits: Home exercise routine, Type of exercise: walking, Time (Minutes): 30, Frequency (Times/Week): 2, Weekly Exercise (Minutes/Week): 60  Goals    . Patient Stated     06/30/2019, no goals       Fall Risk Fall Risk  06/30/2019 05/21/2019 12/18/2018 09/18/2018  Falls in the past year? 0 0 0 0  Risk for fall due to : Medication side effect - - Medication side effect  Follow up Falls evaluation completed;Education provided;Falls prevention discussed - - -   Is the patient's home free of loose throw rugs in walkways, pet beds, electrical cords, etc?   yes      Grab bars in the bathroom? no      Handrails on the stairs?   n/a      Adequate lighting?   yes  Timed Get Up and Go performed: n/a  Depression Screen PHQ 2/9  Scores 06/30/2019 12/18/2018 09/18/2018  PHQ - 2 Score 0 0 0  PHQ- 9 Score 0 - 0     Cognitive Function     6CIT Screen 06/30/2019 09/18/2018  What Year? 0 points 0 points  What month? 0 points 0 points  What time? 0 points 0 points  Count back from 20 0 points 0  points  Months in reverse 2 points 2 points  Repeat phrase 2 points 2 points  Total Score 4 4    Immunization History  Administered Date(s) Administered  . Td 11/06/2005    Qualifies for Shingles Vaccine? yes  Screening Tests Health Maintenance  Topic Date Due  . TETANUS/TDAP  09/19/2019 (Originally 11/07/2015)  . PNA vac Low Risk Adult (1 of 2 - PCV13) 09/19/2019 (Originally 08/18/2001)  . INFLUENZA VACCINE  01/13/2020 (Originally 05/16/2019)  . DEXA SCAN  Discontinued    Cancer Screenings: Lung: Low Dose CT Chest recommended if Age 37-80 years, 30 pack-year currently smoking OR have quit w/in 15years. Patient does not qualify. Breast:  Up to date on Mammogram? No   Up to date of Bone Density/Dexa? Yes Colorectal: not required  Additional Screenings: : Hepatitis C Screening: n/a     Plan:    Patient has no goals at this time. Not ready to quit smoking.   I have personally reviewed and noted the following in the patient's chart:   . Medical and social history . Use of alcohol, tobacco or illicit drugs  . Current medications and supplements . Functional ability and status . Nutritional status . Physical activity . Advanced directives . List of other physicians . Hospitalizations, surgeries, and ER visits in previous 12 months . Vitals . Screenings to include cognitive, depression, and falls . Referrals and appointments  In addition, I have reviewed and discussed with patient certain preventive protocols, quality metrics, and best practice recommendations. A written personalized care plan for preventive services as well as general preventive health recommendations were provided to patient.     Kellie Simmering, LPN  [id number]

## 2019-06-30 NOTE — Patient Instructions (Signed)
Penny Mathews , Thank you for taking time to come for your Medicare Wellness Visit. I appreciate your ongoing commitment to your health goals. Please review the following plan we discussed and let me know if I can assist you in the future.   Screening recommendations/referrals: Colonoscopy: not required Mammogram: not required Bone Density: decline Recommended yearly ophthalmology/optometry visit for glaucoma screening and checkup Recommended yearly dental visit for hygiene and checkup  Vaccinations: Influenza vaccine: decline Pneumococcal vaccine: decline Tdap vaccine: decline Shingles vaccine: discussed    Advanced directives: Please bring a copy of your POA (Power of Attorney) and/or Living Will to your next appointment.    Conditions/risks identified: overweight  Next appointment: 09/30/2019 at 9:00   Preventive Care 10 Years and Older, Female Preventive care refers to lifestyle choices and visits with your health care provider that can promote health and wellness. What does preventive care include?  A yearly physical exam. This is also called an annual well check.  Dental exams once or twice a year.  Routine eye exams. Ask your health care provider how often you should have your eyes checked.  Personal lifestyle choices, including:  Daily care of your teeth and gums.  Regular physical activity.  Eating a healthy diet.  Avoiding tobacco and drug use.  Limiting alcohol use.  Practicing safe sex.  Taking low-dose aspirin every day.  Taking vitamin and mineral supplements as recommended by your health care provider. What happens during an annual well check? The services and screenings done by your health care provider during your annual well check will depend on your age, overall health, lifestyle risk factors, and family history of disease. Counseling  Your health care provider may ask you questions about your:  Alcohol use.  Tobacco use.  Drug use.   Emotional well-being.  Home and relationship well-being.  Sexual activity.  Eating habits.  History of falls.  Memory and ability to understand (cognition).  Work and work Statistician.  Reproductive health. Screening  You may have the following tests or measurements:  Height, weight, and BMI.  Blood pressure.  Lipid and cholesterol levels. These may be checked every 5 years, or more frequently if you are over 49 years old.  Skin check.  Lung cancer screening. You may have this screening every year starting at age 14 if you have a 30-pack-year history of smoking and currently smoke or have quit within the past 15 years.  Fecal occult blood test (FOBT) of the stool. You may have this test every year starting at age 34.  Flexible sigmoidoscopy or colonoscopy. You may have a sigmoidoscopy every 5 years or a colonoscopy every 10 years starting at age 2.  Hepatitis C blood test.  Hepatitis B blood test.  Sexually transmitted disease (STD) testing.  Diabetes screening. This is done by checking your blood sugar (glucose) after you have not eaten for a while (fasting). You may have this done every 1-3 years.  Bone density scan. This is done to screen for osteoporosis. You may have this done starting at age 28.  Mammogram. This may be done every 1-2 years. Talk to your health care provider about how often you should have regular mammograms. Talk with your health care provider about your test results, treatment options, and if necessary, the need for more tests. Vaccines  Your health care provider may recommend certain vaccines, such as:  Influenza vaccine. This is recommended every year.  Tetanus, diphtheria, and acellular pertussis (Tdap, Td) vaccine. You may need a  Td booster every 10 years.  Zoster vaccine. You may need this after age 40.  Pneumococcal 13-valent conjugate (PCV13) vaccine. One dose is recommended after age 29.  Pneumococcal polysaccharide (PPSV23)  vaccine. One dose is recommended after age 56. Talk to your health care provider about which screenings and vaccines you need and how often you need them. This information is not intended to replace advice given to you by your health care provider. Make sure you discuss any questions you have with your health care provider. Document Released: 10/28/2015 Document Revised: 06/20/2016 Document Reviewed: 08/02/2015 Elsevier Interactive Patient Education  2017 [place] Prevention in the Home Falls can cause injuries. They can happen to people of all ages. There are many things you can do to make your home safe and to help prevent falls. What can I do on the outside of my home?  Regularly fix the edges of walkways and driveways and fix any cracks.  Remove anything that might make you trip as you walk through a door, such as a raised step or threshold.  Trim any bushes or trees on the path to your home.  Use bright outdoor lighting.  Clear any walking paths of anything that might make someone trip, such as rocks or tools.  Regularly check to see if handrails are loose or broken. Make sure that both sides of any steps have handrails.  Any raised decks and porches should have guardrails on the edges.  Have any leaves, snow, or ice cleared regularly.  Use sand or salt on walking paths during winter.  Clean up any spills in your garage right away. This includes oil or grease spills. What can I do in the bathroom?  Use night lights.  Install grab bars by the toilet and in the tub and shower. Do not use towel bars as grab bars.  Use non-skid mats or decals in the tub or shower.  If you need to sit down in the shower, use a plastic, non-slip stool.  Keep the floor dry. Clean up any water that spills on the floor as soon as it happens.  Remove soap buildup in the tub or shower regularly.  Attach bath mats securely with double-sided non-slip rug tape.  Do not have throw rugs  and other things on the floor that can make you trip. What can I do in the bedroom?  Use night lights.  Make sure that you have a light by your bed that is easy to reach.  Do not use any sheets or blankets that are too big for your bed. They should not hang down onto the floor.  Have a firm chair that has side arms. You can use this for support while you get dressed.  Do not have throw rugs and other things on the floor that can make you trip. What can I do in the kitchen?  Clean up any spills right away.  Avoid walking on wet floors.  Keep items that you use a lot in easy-to-reach places.  If you need to reach something above you, use a strong step stool that has a grab bar.  Keep electrical cords out of the way.  Do not use floor polish or wax that makes floors slippery. If you must use wax, use non-skid floor wax.  Do not have throw rugs and other things on the floor that can make you trip. What can I do with my stairs?  Do not leave any items on the stairs.  Make sure that there are handrails on both sides of the stairs and use them. Fix handrails that are broken or loose. Make sure that handrails are as long as the stairways.  Check any carpeting to make sure that it is firmly attached to the stairs. Fix any carpet that is loose or worn.  Avoid having throw rugs at the top or bottom of the stairs. If you do have throw rugs, attach them to the floor with carpet tape.  Make sure that you have a light switch at the top of the stairs and the bottom of the stairs. If you do not have them, ask someone to add them for you. What else can I do to help prevent falls?  Wear shoes that:  Do not have high heels.  Have rubber bottoms.  Are comfortable and fit you well.  Are closed at the toe. Do not wear sandals.  If you use a stepladder:  Make sure that it is fully opened. Do not climb a closed stepladder.  Make sure that both sides of the stepladder are locked into  place.  Ask someone to hold it for you, if possible.  Clearly mark and make sure that you can see:  Any grab bars or handrails.  First and last steps.  Where the edge of each step is.  Use tools that help you move around (mobility aids) if they are needed. These include:  Canes.  Walkers.  Scooters.  Crutches.  Turn on the lights when you go into a dark area. Replace any light bulbs as soon as they burn out.  Set up your furniture so you have a clear path. Avoid moving your furniture around.  If any of your floors are uneven, fix them.  If there are any pets around you, be aware of where they are.  Review your medicines with your doctor. Some medicines can make you feel dizzy. This can increase your chance of falling. Ask your doctor what other things that you can do to help prevent falls. This information is not intended to replace advice given to you by your health care provider. Make sure you discuss any questions you have with your health care provider. Document Released: 07/28/2009 Document Revised: 03/08/2016 Document Reviewed: 11/05/2014 Elsevier Interactive Patient Education  2017 [employer].

## 2019-09-30 ENCOUNTER — Ambulatory Visit (INDEPENDENT_AMBULATORY_CARE_PROVIDER_SITE_OTHER): Payer: Medicare HMO | Admitting: Internal Medicine

## 2019-09-30 ENCOUNTER — Ambulatory Visit: Payer: Medicare HMO

## 2019-09-30 ENCOUNTER — Other Ambulatory Visit: Payer: Self-pay

## 2019-09-30 ENCOUNTER — Encounter: Payer: Self-pay | Admitting: Internal Medicine

## 2019-09-30 VITALS — BP 146/84 | HR 93 | Temp 97.6°F | Ht 66.2 in | Wt 165.4 lb

## 2019-09-30 DIAGNOSIS — Z6826 Body mass index (BMI) 26.0-26.9, adult: Secondary | ICD-10-CM

## 2019-09-30 DIAGNOSIS — I129 Hypertensive chronic kidney disease with stage 1 through stage 4 chronic kidney disease, or unspecified chronic kidney disease: Secondary | ICD-10-CM

## 2019-09-30 DIAGNOSIS — H6121 Impacted cerumen, right ear: Secondary | ICD-10-CM | POA: Diagnosis not present

## 2019-09-30 DIAGNOSIS — R7309 Other abnormal glucose: Secondary | ICD-10-CM | POA: Diagnosis not present

## 2019-09-30 DIAGNOSIS — Z0000 Encounter for general adult medical examination without abnormal findings: Secondary | ICD-10-CM

## 2019-09-30 DIAGNOSIS — Z716 Tobacco abuse counseling: Secondary | ICD-10-CM | POA: Diagnosis not present

## 2019-09-30 DIAGNOSIS — E7800 Pure hypercholesterolemia, unspecified: Secondary | ICD-10-CM | POA: Diagnosis not present

## 2019-09-30 DIAGNOSIS — N182 Chronic kidney disease, stage 2 (mild): Secondary | ICD-10-CM | POA: Diagnosis not present

## 2019-09-30 DIAGNOSIS — E663 Overweight: Secondary | ICD-10-CM

## 2019-09-30 LAB — POCT URINALYSIS DIPSTICK
Bilirubin, UA: NEGATIVE
Glucose, UA: NEGATIVE
Ketones, UA: NEGATIVE
Nitrite, UA: NEGATIVE
Protein, UA: NEGATIVE
Spec Grav, UA: 1.02 (ref 1.010–1.025)
Urobilinogen, UA: 0.2 E.U./dL
pH, UA: 7.5 (ref 5.0–8.0)

## 2019-09-30 LAB — POCT UA - MICROALBUMIN
Albumin/Creatinine Ratio, Urine, POC: 30
Creatinine, POC: 100 mg/dL
Microalbumin Ur, POC: 10 mg/L

## 2019-09-30 NOTE — Patient Instructions (Signed)
Health Maintenance, Female Adopting a healthy lifestyle and getting preventive care are important in promoting health and wellness. Ask your health care provider about:  The right schedule for you to have regular tests and exams.  Things you can do on your own to prevent diseases and keep yourself healthy. What should I know about diet, weight, and exercise? Eat a healthy diet   Eat a diet that includes plenty of vegetables, fruits, low-fat dairy products, and lean protein.  Do not eat a lot of foods that are high in solid fats, added sugars, or sodium. Maintain a healthy weight Body mass index (BMI) is used to identify weight problems. It estimates body fat based on height and weight. Your health care provider can help determine your BMI and help you achieve or maintain a healthy weight. Get regular exercise Get regular exercise. This is one of the most important things you can do for your health. Most adults should:  Exercise for at least 150 minutes each week. The exercise should increase your heart rate and make you sweat (moderate-intensity exercise).  Do strengthening exercises at least twice a week. This is in addition to the moderate-intensity exercise.  Spend less time sitting. Even light physical activity can be beneficial. Watch cholesterol and blood lipids Have your blood tested for lipids and cholesterol at 83 years of age, then have this test every 5 years. Have your cholesterol levels checked more often if:  Your lipid or cholesterol levels are high.  You are older than 83 years of age.  You are at high risk for heart disease. What should I know about cancer screening? Depending on your health history and family history, you may need to have cancer screening at various ages. This may include screening for:  Breast cancer.  Cervical cancer.  Colorectal cancer.  Skin cancer.  Lung cancer. What should I know about heart disease, diabetes, and high blood  pressure? Blood pressure and heart disease  High blood pressure causes heart disease and increases the risk of stroke. This is more likely to develop in people who have high blood pressure readings, are of African descent, or are overweight.  Have your blood pressure checked: ? Every 3-5 years if you are 18-39 years of age. ? Every year if you are 40 years old or older. Diabetes Have regular diabetes screenings. This checks your fasting blood sugar level. Have the screening done:  Once every three years after age 40 if you are at a normal weight and have a low risk for diabetes.  More often and at a younger age if you are overweight or have a high risk for diabetes. What should I know about preventing infection? Hepatitis B If you have a higher risk for hepatitis B, you should be screened for this virus. Talk with your health care provider to find out if you are at risk for hepatitis B infection. Hepatitis C Testing is recommended for:  Everyone born from 1945 through 1965.  Anyone with known risk factors for hepatitis C. Sexually transmitted infections (STIs)  Get screened for STIs, including gonorrhea and chlamydia, if: ? You are sexually active and are younger than 83 years of age. ? You are older than 83 years of age and your health care provider tells you that you are at risk for this type of infection. ? Your sexual activity has changed since you were last screened, and you are at increased risk for chlamydia or gonorrhea. Ask your health care provider if   you are at risk.  Ask your health care provider about whether you are at high risk for HIV. Your health care provider may recommend a prescription medicine to help prevent HIV infection. If you choose to take medicine to prevent HIV, you should first get tested for HIV. You should then be tested every 3 months for as long as you are taking the medicine. Pregnancy  If you are about to stop having your period (premenopausal) and  you may become pregnant, seek counseling before you get pregnant.  Take 400 to 800 micrograms (mcg) of folic acid every day if you become pregnant.  Ask for birth control (contraception) if you want to prevent pregnancy. Osteoporosis and menopause Osteoporosis is a disease in which the bones lose minerals and strength with aging. This can result in bone fractures. If you are 65 years old or older, or if you are at risk for osteoporosis and fractures, ask your health care provider if you should:  Be screened for bone loss.  Take a calcium or vitamin D supplement to lower your risk of fractures.  Be given hormone replacement therapy (HRT) to treat symptoms of menopause. Follow these instructions at home: Lifestyle  Do not use any products that contain nicotine or tobacco, such as cigarettes, e-cigarettes, and chewing tobacco. If you need help quitting, ask your health care provider.  Do not use street drugs.  Do not share needles.  Ask your health care provider for help if you need support or information about quitting drugs. Alcohol use  Do not drink alcohol if: ? Your health care provider tells you not to drink. ? You are pregnant, may be pregnant, or are planning to become pregnant.  If you drink alcohol: ? Limit how much you use to 0-1 drink a day. ? Limit intake if you are breastfeeding.  Be aware of how much alcohol is in your drink. In the [place] one drink equals one 12 oz bottle of beer (355 mL), one 5 oz glass of wine (148 mL), or one 1 oz glass of hard liquor (44 mL). General instructions  Schedule regular health, dental, and eye exams.  Stay current with your vaccines.  Tell your health care provider if: ? You often feel depressed. ? You have ever been abused or do not feel safe at home. Summary  Adopting a healthy lifestyle and getting preventive care are important in promoting health and wellness.  Follow your health care provider's instructions about healthy  diet, exercising, and getting tested or screened for diseases.  Follow your health care provider's instructions on monitoring your cholesterol and blood pressure. This information is not intended to replace advice given to you by your health care provider. Make sure you discuss any questions you have with your health care provider. Document Released: 04/16/2011 Document Revised: 09/24/2018 Document Reviewed: 09/24/2018 Elsevier Patient Education  2020 [employer] Inc[employer]  

## 2019-09-30 NOTE — Progress Notes (Addendum)
This visit occurred during the SARS-CoV-2 public health emergency.  Safety protocols were in place, including screening questions prior to the visit, additional usage of staff PPE, and extensive cleaning of exam room while observing appropriate contact time as indicated for disinfecting solutions.  Subjective:     Patient ID: Penny Mathews , female    DOB: December 11, 1933 , 85 y.o.   MRN: [id number]   Chief Complaint  Patient presents with  . Annual Exam  . Hypertension    HPI  She is here today for a full physical examination. She is no longer followed by GYN. She has no specific concerns or complaints at this time.   Hypertension This is a chronic problem. The current episode started more than 1 year ago. The problem has been gradually improving since onset. The problem is controlled. Pertinent negatives include no blurred vision, chest pain, palpitations or shortness of breath. Risk factors for coronary artery disease include dyslipidemia, post-menopausal state and sedentary lifestyle. The current treatment provides moderate improvement. Hypertensive end-organ damage includes kidney disease.     Past Medical History:  Diagnosis Date  . Cancer ([place])    Breast Ca - chemo & radiation 1990  . Hypertension      Family History  Problem Relation Age of Onset  . Healthy Mother   . Heart attack Father   . Lymphoma Sister   . Cancer Sister      Current Outpatient Medications:  .  amLODipine (NORVASC) 5 MG tablet, Take 1 tablet (5 mg total) by mouth daily., Disp: 90 tablet, Rfl: 2 .  aspirin 81 MG tablet, Take 1 tablet (81 mg total) by mouth daily., Disp: 30 tablet, Rfl:  .  linaclotide (LINZESS) 72 MCG capsule, Take 1 capsule (72 mcg total) by mouth daily before breakfast., Disp: 30 capsule, Rfl: 1 .  pravastatin (PRAVACHOL) 40 MG tablet, Take 1 tablet (40 mg total) by mouth daily., Disp: 90 tablet, Rfl: 2 .  telmisartan (MICARDIS) 20 MG tablet, Take 1 tablet (20 mg total) by mouth  daily., Disp: 90 tablet, Rfl: 2   No Known Allergies    The patient states she uses post menopausal status for birth control. Last LMP was No LMP recorded. Patient has had a hysterectomy.. Negative for Dysmenorrhea  Negative for: breast discharge, breast lump(s), breast pain and breast self exam. Associated symptoms include abnormal vaginal bleeding. Pertinent negatives include abnormal bleeding (hematology), anxiety, decreased libido, depression, difficulty falling sleep, dyspareunia, history of infertility, nocturia, sexual dysfunction, sleep disturbances, urinary incontinence, urinary urgency, vaginal discharge and vaginal itching. Diet regular.The patient states her exercise level is  intermittent.   . The patient's tobacco use is:  Social History   Tobacco Use  Smoking Status Current Every Day Smoker  . Packs/day: 0.12  . Years: 50.00  . Pack years: 6.00  . Types: Cigarettes  Smokeless Tobacco Never Used  Tobacco Comment   encouraged to cut back on number of cigs smoked/day  . She has been exposed to passive smoke. The patient's alcohol use is:  Social History   Substance and Sexual Activity  Alcohol Use Yes   Comment: on occasion    Review of Systems  Constitutional: Negative.   HENT: Negative.   Eyes: Negative.  Negative for blurred vision.  Respiratory: Negative.  Negative for shortness of breath.   Cardiovascular: Negative.  Negative for chest pain and palpitations.  Endocrine: Negative.   Genitourinary: Negative.   Musculoskeletal: Negative.   Skin: Negative.   Allergic/Immunologic:  Negative.   Neurological: Negative.   Hematological: Negative.   Psychiatric/Behavioral: Negative.      Today's Vitals   09/30/19 0925  BP: (!) 146/84  Pulse: 93  Temp: 97.6 F (36.4 C)  TempSrc: Oral  Weight: 165 lb 6.4 oz (75 kg)  Height: 5' 6.2" (1.681 m)  PainSc: 0-No pain   Body mass index is 26.54 kg/m.   Objective:  Physical Exam Vitals and nursing note reviewed.   Constitutional:      Appearance: Normal appearance.  HENT:     Head: Normocephalic and atraumatic.     Right Ear: Ear canal and external ear normal. There is impacted cerumen.     Left Ear: Tympanic membrane, ear canal and external ear normal.     Nose:     Comments: Deferred, masked    Mouth/Throat:     Comments: Deferred, masked Eyes:     Extraocular Movements: Extraocular movements intact.     Conjunctiva/sclera: Conjunctivae normal.     Pupils: Pupils are equal, round, and reactive to light.  Cardiovascular:     Rate and Rhythm: Normal rate and regular rhythm.     Pulses: Normal pulses.     Heart sounds: Normal heart sounds.  Pulmonary:     Effort: Pulmonary effort is normal.     Breath sounds: Normal breath sounds.  Abdominal:     General: Abdomen is flat. Bowel sounds are normal.     Palpations: Abdomen is soft.  Genitourinary:    Comments: deferred Musculoskeletal:        General: Normal range of motion.     Cervical back: Normal range of motion and neck supple.  Skin:    General: Skin is warm and dry.  Neurological:     General: No focal deficit present.     Mental Status: She is alert and oriented to person, place, and time.  Psychiatric:        Mood and Affect: Mood normal.        Behavior: Behavior normal.         Assessment And Plan:     1. Routine general medical examination at health care facility  A full exam was performed.  Importance of monthly self breast exams was discussed with the patient. PATIENT HAS BEEN ADVISED TO GET 30-45 MINUTES REGULAR EXERCISE NO LESS THAN FOUR TO FIVE DAYS PER WEEK - BOTH WEIGHTBEARING EXERCISES AND AEROBIC ARE RECOMMENDED.  SHE IS ADVISED TO FOLLOW A HEALTHY DIET WITH AT LEAST SIX FRUITS/VEGGIES PER DAY, DECREASE INTAKE OF RED MEAT, AND TO INCREASE FISH INTAKE TO TWO DAYS PER WEEK.  MEATS/FISH SHOULD NOT BE FRIED, BAKED OR BROILED IS PREFERABLE.  I SUGGEST WEARING SPF 50 SUNSCREEN ON EXPOSED PARTS AND ESPECIALLY WHEN IN  THE DIRECT SUNLIGHT FOR AN EXTENDED PERIOD OF TIME.  PLEASE AVOID FAST FOOD RESTAURANTS AND INCREASE YOUR WATER INTAKE.   2. Hypertensive nephropathy  Chronic, fair control. She will continue with current meds for now she is encouraged to follow a low sodium diet. I will check labs as listed below.   - CMP14+EGFR - CBC - TSH  3. Chronic renal disease, stage II  Chronic, yet stable. I will check GFR, Cr today. She is encouraged to stay well hydrated.   4. Pure hypercholesterolemia  Chronic. I will check lipid panel. She is encouraged to avoid fried foods, increase activity as tolerated and increase her fish intake.   - Lipid panel  5. Other abnormal glucose  HER A1C  HAS BEEN ELEVATED IN THE PAST. I WILL CHECK AN A1C, BMET TODAY. SHE WAS ENCOURAGED TO AVOID SUGARY BEVERAGES AND PROCESSED FOODS INCLUDNG BREADS, RICE AND PASTA.  - Hemoglobin A1c   6. Right ear impacted cerumen  AFTER OBTAINING VERBAL CONSENT, RIGHT EAR WAS FLUSHED BY IRRIGATION. SHE TOLERATED PROCEDURE WELL WITHOUT ANY COMPLICATIONS. NO TM ABNORMALITIES WERE NOTED.  - Ear Lavage  7. Overweight with body mass index (BMI) of 26 to 26.9 in adult  She is encouraged to incorporate more exercise into her daily routine. Advised to strive to lose 7-10 pounds to decrease cardiac risk.   8. Encounter for tobacco use cessation counseling  Smoking cessation instruction/counseling given:  counseled patient on the dangers of tobacco use, advised patient to stop smoking, and reviewed strategies to maximize success   Maximino Greenland, MD    THE PATIENT IS ENCOURAGED TO PRACTICE SOCIAL DISTANCING DUE TO THE COVID-19 PANDEMIC.

## 2019-10-01 LAB — CMP14+EGFR
ALT: 14 IU/L (ref 0–32)
AST: 20 IU/L (ref 0–40)
Albumin/Globulin Ratio: 2 (ref 1.2–2.2)
Albumin: 4.7 g/dL — ABNORMAL HIGH (ref 3.6–4.6)
Alkaline Phosphatase: 101 IU/L (ref 39–117)
BUN/Creatinine Ratio: 8 — ABNORMAL LOW (ref 12–28)
BUN: 7 mg/dL — ABNORMAL LOW (ref 8–27)
Bilirubin Total: 0.4 mg/dL (ref 0.0–1.2)
CO2: 26 mmol/L (ref 20–29)
Calcium: 9.7 mg/dL (ref 8.7–10.3)
Chloride: 101 mmol/L (ref 96–106)
Creatinine, Ser: 0.85 mg/dL (ref 0.57–1.00)
GFR calc Af Amer: 73 mL/min/{1.73_m2} (ref >59)
GFR calc non Af Amer: 64 mL/min/{1.73_m2} (ref >59)
Globulin, Total: 2.3 g/dL (ref 1.5–4.5)
Glucose: 108 mg/dL — ABNORMAL HIGH (ref 65–99)
Potassium: 4.6 mmol/L (ref 3.5–5.2)
Sodium: 140 mmol/L (ref 134–144)
Total Protein: 7 g/dL (ref 6.0–8.5)

## 2019-10-01 LAB — LIPID PANEL
Chol/HDL Ratio: 2.9 ratio (ref 0.0–4.4)
Cholesterol, Total: 260 mg/dL — ABNORMAL HIGH (ref 100–199)
HDL: 89 mg/dL (ref >39)
LDL Chol Calc (NIH): 140 mg/dL — ABNORMAL HIGH (ref 0–99)
Triglycerides: 179 mg/dL — ABNORMAL HIGH (ref 0–149)
VLDL Cholesterol Cal: 31 mg/dL (ref 5–40)

## 2019-10-01 LAB — CBC
Hematocrit: 40.2 % (ref 34.0–46.6)
Hemoglobin: 13.9 g/dL (ref 11.1–15.9)
MCH: 30.5 pg (ref 26.6–33.0)
MCHC: 34.6 g/dL (ref 31.5–35.7)
MCV: 88 fL (ref 79–97)
Platelets: 262 10*3/uL (ref 150–450)
RBC: 4.56 x10E6/uL (ref 3.77–5.28)
RDW: 13 % (ref 11.7–15.4)
WBC: 5.5 10*3/uL (ref 3.4–10.8)

## 2019-10-01 LAB — HEMOGLOBIN A1C
Est. average glucose Bld gHb Est-mCnc: 126 mg/dL
Hgb A1c MFr Bld: 6 % — ABNORMAL HIGH (ref 4.8–5.6)

## 2019-10-01 LAB — TSH: TSH: 1.29 u[IU]/mL (ref 0.450–4.500)

## 2019-11-16 ENCOUNTER — Other Ambulatory Visit: Payer: Self-pay | Admitting: Internal Medicine

## 2019-12-17 ENCOUNTER — Ambulatory Visit: Payer: Medicare HMO | Attending: Internal Medicine

## 2019-12-17 DIAGNOSIS — Z23 Encounter for immunization: Secondary | ICD-10-CM | POA: Insufficient documentation

## 2019-12-17 NOTE — Progress Notes (Signed)
   Covid-19 Vaccination Clinic  Name:  Penny Mathews    MRN: [id number] DOB: 06-Dec-1933  12/17/2019  [name] was observed post Covid-19 immunization for 15 minutes without incident. She was provided with Vaccine Information Sheet and instruction to access the V-Safe system.   [name] was instructed to call 911 with any severe reactions post vaccine: Marland Kitchen Difficulty breathing  . Swelling of face and throat  . A fast heartbeat  . A bad rash all over body  . Dizziness and weakness   Immunizations Administered    Name Date Dose VIS Date Route   Pfizer COVID-19 Vaccine 12/17/2019  9:41 AM 0.3 mL 09/25/2019 Intramuscular   Manufacturer: [place]   Lot: [id number]   [place]: [id number]

## 2020-01-13 ENCOUNTER — Ambulatory Visit: Payer: Medicare HMO | Attending: Internal Medicine

## 2020-01-13 DIAGNOSIS — Z23 Encounter for immunization: Secondary | ICD-10-CM

## 2020-01-13 NOTE — Progress Notes (Signed)
   Covid-19 Vaccination Clinic  Name:  Penny Mathews    MRN: [id number] DOB: Jul 11, 1934  01/13/2020  [name] was observed post Covid-19 immunization for 15 minutes without incident. She was provided with Vaccine Information Sheet and instruction to access the V-Safe system.   [name] was instructed to call 911 with any severe reactions post vaccine: Marland Kitchen Difficulty breathing  . Swelling of face and throat  . A fast heartbeat  . A bad rash all over body  . Dizziness and weakness   Immunizations Administered    Name Date Dose VIS Date Route   Pfizer COVID-19 Vaccine 01/13/2020  9:46 AM 0.3 mL 09/25/2019 Intramuscular   Manufacturer: [place]   Lot: [id number]   [place]: [id number]

## 2020-03-31 ENCOUNTER — Telehealth: Payer: Self-pay

## 2020-03-31 ENCOUNTER — Encounter: Payer: Self-pay | Admitting: Internal Medicine

## 2020-03-31 ENCOUNTER — Other Ambulatory Visit: Payer: Self-pay

## 2020-03-31 ENCOUNTER — Ambulatory Visit (INDEPENDENT_AMBULATORY_CARE_PROVIDER_SITE_OTHER): Payer: Medicare HMO | Admitting: Internal Medicine

## 2020-03-31 VITALS — BP 162/80 | HR 76 | Temp 97.7°F | Ht 62.2 in | Wt 160.2 lb

## 2020-03-31 DIAGNOSIS — N182 Chronic kidney disease, stage 2 (mild): Secondary | ICD-10-CM

## 2020-03-31 DIAGNOSIS — K5904 Chronic idiopathic constipation: Secondary | ICD-10-CM

## 2020-03-31 DIAGNOSIS — Z716 Tobacco abuse counseling: Secondary | ICD-10-CM | POA: Diagnosis not present

## 2020-03-31 DIAGNOSIS — I129 Hypertensive chronic kidney disease with stage 1 through stage 4 chronic kidney disease, or unspecified chronic kidney disease: Secondary | ICD-10-CM | POA: Diagnosis not present

## 2020-03-31 DIAGNOSIS — E663 Overweight: Secondary | ICD-10-CM | POA: Diagnosis not present

## 2020-03-31 DIAGNOSIS — E7800 Pure hypercholesterolemia, unspecified: Secondary | ICD-10-CM

## 2020-03-31 DIAGNOSIS — R7309 Other abnormal glucose: Secondary | ICD-10-CM | POA: Diagnosis not present

## 2020-03-31 MED ORDER — PRAVASTATIN SODIUM 40 MG PO TABS: 40.0000 mg | ORAL_TABLET | Freq: Every day | ORAL | 2 refills | Status: AC

## 2020-03-31 MED ORDER — AMLODIPINE BESYLATE 5 MG PO TABS: 5.0000 mg | ORAL_TABLET | Freq: Every day | ORAL | 2 refills | Status: DC

## 2020-03-31 MED ORDER — TELMISARTAN 20 MG PO TABS: 20.0000 mg | ORAL_TABLET | Freq: Every day | ORAL | 2 refills | Status: DC

## 2020-03-31 NOTE — Patient Instructions (Signed)
Please take Telmisartan 20mg  in the mornings  Take amlodipine 5mg  in the evenings with pravastatin.    Hypertension, Adult Hypertension is another name for high blood pressure. High blood pressure forces your heart to work harder to pump blood. This can cause problems over time. There are two numbers in a blood pressure reading. There is a top number (systolic) over a bottom number (diastolic). It is best to have a blood pressure that is below 120/80. Healthy choices can help lower your blood pressure, or you may need medicine to help lower it. What are the causes? The cause of this condition is not known. Some conditions may be related to high blood pressure. What increases the risk?  Smoking.  Having type 2 diabetes mellitus, high cholesterol, or both.  Not getting enough exercise or physical activity.  Being overweight.  Having too much fat, sugar, calories, or salt (sodium) in your diet.  Drinking too much alcohol.  Having long-term (chronic) kidney disease.  Having a family history of high blood pressure.  Age. Risk increases with age.  Race. You may be at higher risk if you are African American.  Gender. Men are at higher risk than women before age 85. After age 74, women are at higher risk than men.  Having obstructive sleep apnea.  Stress. What are the signs or symptoms?  High blood pressure may not cause symptoms. Very high blood pressure (hypertensive crisis) may cause: ? Headache. ? Feelings of worry or nervousness (anxiety). ? Shortness of breath. ? Nosebleed. ? A feeling of being sick to your stomach (nausea). ? Throwing up (vomiting). ? Changes in how you see. ? Very bad chest pain. ? Seizures. How is this treated?  This condition is treated by making healthy lifestyle changes, such as: ? Eating healthy foods. ? Exercising more. ? Drinking less alcohol.  Your health care provider may prescribe medicine if lifestyle changes are not enough to get  your blood pressure under control, and if: ? Your top number is above 130. ? Your bottom number is above 80.  Your personal target blood pressure may vary. Follow these instructions at home: Eating and drinking   If told, follow the DASH eating plan. To follow this plan: ? Fill one half of your plate at each meal with fruits and vegetables. ? Fill one fourth of your plate at each meal with whole grains. Whole grains include whole-wheat pasta, brown rice, and whole-grain bread. ? Eat or drink low-fat dairy products, such as skim milk or low-fat yogurt. ? Fill one fourth of your plate at each meal with low-fat (lean) proteins. Low-fat proteins include fish, chicken without skin, eggs, beans, and tofu. ? Avoid fatty meat, cured and processed meat, or chicken with skin. ? Avoid pre-made or processed food.  Eat less than 1,500 mg of salt each day.  Do not drink alcohol if: ? Your doctor tells you not to drink. ? You are pregnant, may be pregnant, or are planning to become pregnant.  If you drink alcohol: ? Limit how much you use to:  0-1 drink a day for women.  0-2 drinks a day for men. ? Be aware of how much alcohol is in your drink. In the [place] one drink equals one 12 oz bottle of beer (355 mL), one 5 oz glass of wine (148 mL), or one 1 oz glass of hard liquor (44 mL). Lifestyle   Work with your doctor to stay at a healthy weight or to lose weight.  Ask your doctor what the best weight is for you.  Get at least 30 minutes of exercise most days of the week. This may include walking, swimming, or biking.  Get at least 30 minutes of exercise that strengthens your muscles (resistance exercise) at least 3 days a week. This may include lifting weights or doing Pilates.  Do not use any products that contain nicotine or tobacco, such as cigarettes, e-cigarettes, and chewing tobacco. If you need help quitting, ask your doctor.  Check your blood pressure at home as told by your  doctor.  Keep all follow-up visits as told by your doctor. This is important. Medicines  Take over-the-counter and prescription medicines only as told by your doctor. Follow directions carefully.  Do not skip doses of blood pressure medicine. The medicine does not work as well if you skip doses. Skipping doses also puts you at risk for problems.  Ask your doctor about side effects or reactions to medicines that you should watch for. Contact a doctor if you:  Think you are having a reaction to the medicine you are taking.  Have headaches that keep coming back (recurring).  Feel dizzy.  Have swelling in your ankles.  Have trouble with your vision. Get help right away if you:  Get a very bad headache.  Start to feel mixed up (confused).  Feel weak or numb.  Feel faint.  Have very bad pain in your: ? Chest. ? Belly (abdomen).  Throw up more than once.  Have trouble breathing. Summary  Hypertension is another name for high blood pressure.  High blood pressure forces your heart to work harder to pump blood.  For most people, a normal blood pressure is less than 120/80.  Making healthy choices can help lower blood pressure. If your blood pressure does not get lower with healthy choices, you may need to take medicine. This information is not intended to replace advice given to you by your health care provider. Make sure you discuss any questions you have with your health care provider. Document Revised: 06/11/2018 Document Reviewed: 06/11/2018 Elsevier Patient Education  2020 [employer].

## 2020-03-31 NOTE — Progress Notes (Signed)
This visit occurred during the SARS-CoV-2 public health emergency.  Safety protocols were in place, including screening questions prior to the visit, additional usage of staff PPE, and extensive cleaning of exam room while observing appropriate contact time as indicated for disinfecting solutions.  Subjective:     Patient ID: Penny Mathews , female    DOB: 1933-12-24 , 86 y.o.   MRN: [id number]   Chief Complaint  Patient presents with  . Hypertension  . Hyperlipidemia    HPI  She presents today for BP check and chol check. She reports compliance with meds. However, adds that she has been out of telmisartan for "some time"   Hypertension This is a chronic problem. The current episode started more than 1 year ago. The problem has been gradually improving since onset. The problem is controlled. Pertinent negatives include no blurred vision, chest pain, palpitations or shortness of breath. Risk factors for coronary artery disease include dyslipidemia and post-menopausal state. Hypertensive end-organ damage includes kidney disease.  Hyperlipidemia This is a chronic problem. The problem is uncontrolled. Factors aggravating her hyperlipidemia include smoking. Pertinent negatives include no chest pain or shortness of breath. Current antihyperlipidemic treatment includes statins.     Past Medical History:  Diagnosis Date  . Cancer ([place])    Breast Ca - chemo & radiation 1990  . Hypertension      Family History  Problem Relation Age of Onset  . Healthy Mother   . Heart attack Father   . Lymphoma Sister   . Cancer Sister      Current Outpatient Medications:  .  aspirin 81 MG tablet, Take 1 tablet (81 mg total) by mouth daily., Disp: 30 tablet, Rfl:  .  amLODipine (NORVASC) 5 MG tablet, Take 1 tablet (5 mg total) by mouth daily., Disp: 90 tablet, Rfl: 2 .  pravastatin (PRAVACHOL) 40 MG tablet, Take 1 tablet (40 mg total) by mouth daily., Disp: 90 tablet, Rfl: 2 .  telmisartan  (MICARDIS) 20 MG tablet, Take 1 tablet (20 mg total) by mouth daily., Disp: 90 tablet, Rfl: 2   No Known Allergies   Review of Systems  Constitutional: Negative.   Eyes: Negative for blurred vision.  Respiratory: Negative.  Negative for shortness of breath.   Cardiovascular: Negative.  Negative for chest pain and palpitations.  Gastrointestinal: Positive for constipation.       She c/o "bad" constipation. Has tried Miralax, without relief. Has to take Dulcolax 2-3 times per week to have a bowel movement.   Neurological: Negative.   Psychiatric/Behavioral: Negative.      Today's Vitals   03/31/20 0915  BP: (!) 162/80  Pulse: 76  Temp: 97.7 F (36.5 C)  TempSrc: Oral  Weight: 160 lb 3.2 oz (72.7 kg)  Height: 5' 2.2" (1.58 m)  PainSc: 0-No pain   Body mass index is 29.11 kg/m.   Objective:  Physical Exam Vitals and nursing note reviewed.  Constitutional:      Appearance: Normal appearance.  HENT:     Head: Normocephalic and atraumatic.  Cardiovascular:     Rate and Rhythm: Normal rate and regular rhythm.     Heart sounds: Normal heart sounds.  Pulmonary:     Effort: Pulmonary effort is normal.     Breath sounds: Normal breath sounds.  Skin:    General: Skin is warm.  Neurological:     General: No focal deficit present.     Mental Status: She is alert.  Psychiatric:  Mood and Affect: Mood normal.        Behavior: Behavior normal.         Assessment And Plan:   1. Hypertensive nephropathy  Chronic, uncontrolled due to noncompliance with telmisartan. Refill was sent to the pharmacy. Advised to take telmisartan in AM and amlodipine in PM. Importance of dietary and medication compliance was discussed with the patient. She will rto in four weeks for re-evaluation.   - CMP14+EGFR - Lipid panel  2. Chronic renal disease, stage II  Chronic, I will check renal function today. She is encouraged to stay well hydrated. Importance of keeping BP under optimal  control was discussed with the patient.   3. Pure hypercholesterolemia  Chronic, I will check fasting lipid panel. She is encouraged to avoid fried foods, take meds daily and to increase the fiber in her diet.   4. Other abnormal glucose  HER A1C HAS BEEN ELEVATED IN THE PAST. I WILL CHECK AN A1C, BMET TODAY. SHE WAS ENCOURAGED TO AVOID SUGARY BEVERAGES AND PROCESSED FOODS INCLUDNG BREADS, RICE AND PASTA.  - Hemoglobin A1c  5. Chronic idiopathic constipation  She was given samples of Linzess, 162m to take once daily upon awakening. She is advised to wait 30 minutes prior to eating.   6. Overweight with body mass index (BMI) 25.0-29.9  BMI 29. Her weight is stable for her demographic. She is encouraged to increase her daily activity, working up to 30 minutes five days per week.    7. Encounter for tobacco use cessation counseling  Smoking cessation instruction/counseling given:  counseled patient on the dangers of tobacco use, advised patient to stop smoking, and reviewed strategies to maximize success   RMaximino Greenland MD    THE PATIENT IS ENCOURAGED TO PRACTICE SOCIAL DISTANCING DUE TO THE COVID-19 PANDEMIC.

## 2020-03-31 NOTE — Telephone Encounter (Signed)
The pt was told that the office got an alert from [facility] that she may not be compliant with her pravastatin medication.  The pt said that she has been taking her cholesterol medication daily.

## 2020-04-01 LAB — CMP14+EGFR
ALT: 17 IU/L (ref 0–32)
AST: 21 IU/L (ref 0–40)
Albumin/Globulin Ratio: 1.7 (ref 1.2–2.2)
Albumin: 4.2 g/dL (ref 3.6–4.6)
Alkaline Phosphatase: 100 IU/L (ref 48–121)
BUN/Creatinine Ratio: 10 — ABNORMAL LOW (ref 12–28)
BUN: 11 mg/dL (ref 8–27)
Bilirubin Total: 0.5 mg/dL (ref 0.0–1.2)
CO2: 24 mmol/L (ref 20–29)
Calcium: 9.7 mg/dL (ref 8.7–10.3)
Chloride: 103 mmol/L (ref 96–106)
Creatinine, Ser: 1.08 mg/dL — ABNORMAL HIGH (ref 0.57–1.00)
GFR calc Af Amer: 55 mL/min/{1.73_m2} — ABNORMAL LOW (ref >59)
GFR calc non Af Amer: 48 mL/min/{1.73_m2} — ABNORMAL LOW (ref >59)
Globulin, Total: 2.5 g/dL (ref 1.5–4.5)
Glucose: 89 mg/dL (ref 65–99)
Potassium: 4.9 mmol/L (ref 3.5–5.2)
Sodium: 141 mmol/L (ref 134–144)
Total Protein: 6.7 g/dL (ref 6.0–8.5)

## 2020-04-01 LAB — LIPID PANEL
Chol/HDL Ratio: 2.3 ratio (ref 0.0–4.4)
Cholesterol, Total: 209 mg/dL — ABNORMAL HIGH (ref 100–199)
HDL: 90 mg/dL (ref >39)
LDL Chol Calc (NIH): 106 mg/dL — ABNORMAL HIGH (ref 0–99)
Triglycerides: 75 mg/dL (ref 0–149)
VLDL Cholesterol Cal: 13 mg/dL (ref 5–40)

## 2020-04-01 LAB — HEMOGLOBIN A1C
Est. average glucose Bld gHb Est-mCnc: 126 mg/dL
Hgb A1c MFr Bld: 6 % — ABNORMAL HIGH (ref 4.8–5.6)

## 2020-04-07 ENCOUNTER — Other Ambulatory Visit: Payer: Self-pay

## 2020-05-03 ENCOUNTER — Other Ambulatory Visit: Payer: Self-pay

## 2020-05-03 ENCOUNTER — Encounter: Payer: Self-pay | Admitting: Internal Medicine

## 2020-05-03 ENCOUNTER — Ambulatory Visit (INDEPENDENT_AMBULATORY_CARE_PROVIDER_SITE_OTHER): Payer: Medicare HMO | Admitting: Internal Medicine

## 2020-05-03 VITALS — BP 168/90 | HR 63 | Temp 97.8°F | Ht 64.2 in | Wt 162.4 lb

## 2020-05-03 DIAGNOSIS — I129 Hypertensive chronic kidney disease with stage 1 through stage 4 chronic kidney disease, or unspecified chronic kidney disease: Secondary | ICD-10-CM | POA: Diagnosis not present

## 2020-05-03 DIAGNOSIS — E663 Overweight: Secondary | ICD-10-CM | POA: Diagnosis not present

## 2020-05-03 DIAGNOSIS — N182 Chronic kidney disease, stage 2 (mild): Secondary | ICD-10-CM | POA: Diagnosis not present

## 2020-05-03 DIAGNOSIS — Z6826 Body mass index (BMI) 26.0-26.9, adult: Secondary | ICD-10-CM | POA: Diagnosis not present

## 2020-05-03 NOTE — Progress Notes (Signed)
This visit occurred during the SARS-CoV-2 public health emergency.  Safety protocols were in place, including screening questions prior to the visit, additional usage of staff PPE, and extensive cleaning of exam room while observing appropriate contact time as indicated for disinfecting solutions.  Subjective:     Patient ID: Penny Mathews , female    DOB: 03/18/1934 , 86 y.o.   MRN: [id number]   Chief Complaint  Patient presents with  . Hypertension    HPI  She presents today for BP check . She reports compliance with meds.  Hypertension This is a chronic problem. The current episode started more than 1 year ago. The problem has been gradually worsening since onset. The problem is controlled. Pertinent negatives include no blurred vision. Risk factors for coronary artery disease include dyslipidemia and post-menopausal state. Hypertensive end-organ damage includes kidney disease.    BP Readings from Last 3 Encounters:  05/03/20 (!) 168/90  03/31/20 (!) 162/80  09/30/19 (!) 146/84     Past Medical History:  Diagnosis Date  . Cancer ([place])    Breast Ca - chemo & radiation 1990  . Hypertension      Family History  Problem Relation Age of Onset  . Healthy Mother   . Heart attack Father   . Lymphoma Sister   . Cancer Sister      Current Outpatient Medications:  .  amLODipine (NORVASC) 5 MG tablet, Take 1 tablet (5 mg total) by mouth daily., Disp: 90 tablet, Rfl: 2 .  aspirin 81 MG tablet, Take 1 tablet (81 mg total) by mouth daily., Disp: 30 tablet, Rfl:  .  linaclotide (LINZESS) 290 MCG CAPS capsule, Take 290 mcg by mouth daily before breakfast., Disp: , Rfl:  .  pravastatin (PRAVACHOL) 40 MG tablet, Take 1 tablet (40 mg total) by mouth daily., Disp: 90 tablet, Rfl: 2 .  telmisartan (MICARDIS) 20 MG tablet, Take 1 tablet (20 mg total) by mouth daily., Disp: 90 tablet, Rfl: 2   No Known Allergies   Review of Systems  Constitutional: Negative.   Eyes: Negative for  blurred vision.  Respiratory: Negative.   Cardiovascular: Negative.   Gastrointestinal: Negative.   Neurological: Negative.      Today's Vitals   05/03/20 1201  BP: (!) 168/90  Pulse: 63  Temp: 97.8 F (36.6 C)  TempSrc: Oral  Weight: 162 lb 6.4 oz (73.7 kg)  Height: 5' 4.2" (1.631 m)   Body mass index is 27.7 kg/m.     Objective:  Physical Exam Vitals and nursing note reviewed.  Constitutional:      Appearance: Normal appearance.  HENT:     Head: Normocephalic and atraumatic.  Cardiovascular:     Rate and Rhythm: Normal rate and regular rhythm.     Heart sounds: Normal heart sounds.  Pulmonary:     Effort: Pulmonary effort is normal.     Breath sounds: Normal breath sounds.  Skin:    General: Skin is warm.  Neurological:     General: No focal deficit present.     Mental Status: She is alert.  Psychiatric:        Mood and Affect: Mood normal.        Behavior: Behavior normal.         Assessment And Plan:     1. Hypertensive nephropathy  Comments: Uncontrolled. I will increase her amlodipine to 10mg  once nightly, starting tonite. Patient verbalizes understanding of her treatment plan. She will rto in 2 wks for  a nurse visit. Reminded to avoid adding salt to her foods.   2. Chronic renal disease, stage II  Chronic. She is encouraged to stay well hydrated. Importance of achieving optimal BP control was discussed with the patient.   3. Overweight with body mass index (BMI) of 26 to 26.9 in adult   She is encouraged to strive for BMI less than 30 to decrease cardiac risk. Advised to aim for at least 150 minutes of exercise per week.  Wt Readings from Last 3 Encounters:  05/03/20 162 lb 6.4 oz (73.7 kg)  03/31/20 160 lb 3.2 oz (72.7 kg)  09/30/19 165 lb 6.4 oz (75 kg)      Patient was given opportunity to ask questions. Patient verbalized understanding of the plan and was able to repeat key elements of the plan. All questions were answered to their  satisfaction.   Maximino Greenland, MD   I, Maximino Greenland, MD, have reviewed all documentation for this visit. The documentation on 05/03/20 for the exam, diagnosis, procedures, and orders are all accurate and complete.  THE PATIENT IS ENCOURAGED TO PRACTICE SOCIAL DISTANCING DUE TO THE COVID-19 PANDEMIC.

## 2020-05-03 NOTE — Patient Instructions (Signed)
Increase amlodipine to 10mg  nightly, take 2 - 5mg  tablets every night, starting tonite 7/20   Managing Your Hypertension Hypertension is commonly called high blood pressure. This is when the force of your blood pressing against the walls of your arteries is too strong. Arteries are blood vessels that carry blood from your heart throughout your body. Hypertension forces the heart to work harder to pump blood, and may cause the arteries to become narrow or stiff. Having untreated or uncontrolled hypertension can cause heart attack, stroke, kidney disease, and other problems. What are blood pressure readings? A blood pressure reading consists of a higher number over a lower number. Ideally, your blood pressure should be below 120/80. The first ("top") number is called the systolic pressure. It is a measure of the pressure in your arteries as your heart beats. The second ("bottom") number is called the diastolic pressure. It is a measure of the pressure in your arteries as the heart relaxes. What does my blood pressure reading mean? Blood pressure is classified into four stages. Based on your blood pressure reading, your health care provider may use the following stages to determine what type of treatment you need, if any. Systolic pressure and diastolic pressure are measured in a unit called mm Hg. Normal  Systolic pressure: below 144.  Diastolic pressure: below 80. Elevated  Systolic pressure: 818-563.  Diastolic pressure: below 80. Hypertension stage 1  Systolic pressure: 149-702.  Diastolic pressure: 63-78. Hypertension stage 2  Systolic pressure: 588 or above.  Diastolic pressure: 90 or above. What health risks are associated with hypertension? Managing your hypertension is an important responsibility. Uncontrolled hypertension can lead to:  A heart attack.  A stroke.  A weakened blood vessel (aneurysm).  Heart failure.  Kidney damage.  Eye damage.  Metabolic  syndrome.  Memory and concentration problems. What changes can I make to manage my hypertension? Hypertension can be managed by making lifestyle changes and possibly by taking medicines. Your health care provider will help you make a plan to bring your blood pressure within a normal range. Eating and drinking   Eat a diet that is high in fiber and potassium, and low in salt (sodium), added sugar, and fat. An example eating plan is called the DASH (Dietary Approaches to Stop Hypertension) diet. To eat this way: ? Eat plenty of fresh fruits and vegetables. Try to fill half of your plate at each meal with fruits and vegetables. ? Eat whole grains, such as whole wheat pasta, brown rice, or whole grain bread. Fill about one quarter of your plate with whole grains. ? Eat low-fat diary products. ? Avoid fatty cuts of meat, processed or cured meats, and poultry with skin. Fill about one quarter of your plate with lean proteins such as fish, chicken without skin, beans, eggs, and tofu. ? Avoid premade and processed foods. These tend to be higher in sodium, added sugar, and fat.  Reduce your daily sodium intake. Most people with hypertension should eat less than 1,500 mg of sodium a day.  Limit alcohol intake to no more than 1 drink a day for nonpregnant women and 2 drinks a day for men. One drink equals 12 oz of beer, 5 oz of wine, or 1 oz of hard liquor. Lifestyle  Work with your health care provider to maintain a healthy body weight, or to lose weight. Ask what an ideal weight is for you.  Get at least 30 minutes of exercise that causes your heart to beat faster (  aerobic exercise) most days of the week. Activities may include walking, swimming, or biking.  Include exercise to strengthen your muscles (resistance exercise), such as weight lifting, as part of your weekly exercise routine. Try to do these types of exercises for 30 minutes at least 3 days a week.  Do not use any products that contain  nicotine or tobacco, such as cigarettes and e-cigarettes. If you need help quitting, ask your health care provider.  Control any long-term (chronic) conditions you have, such as high cholesterol or diabetes. Monitoring  Monitor your blood pressure at home as told by your health care provider. Your personal target blood pressure may vary depending on your medical conditions, your age, and other factors.  Have your blood pressure checked regularly, as often as told by your health care provider. Working with your health care provider  Review all the medicines you take with your health care provider because there may be side effects or interactions.  Talk with your health care provider about your diet, exercise habits, and other lifestyle factors that may be contributing to hypertension.  Visit your health care provider regularly. Your health care provider can help you create and adjust your plan for managing hypertension. Will I need medicine to control my blood pressure? Your health care provider may prescribe medicine if lifestyle changes are not enough to get your blood pressure under control, and if:  Your systolic blood pressure is 130 or higher.  Your diastolic blood pressure is 80 or higher. Take medicines only as told by your health care provider. Follow the directions carefully. Blood pressure medicines must be taken as prescribed. The medicine does not work as well when you skip doses. Skipping doses also puts you at risk for problems. Contact a health care provider if:  You think you are having a reaction to medicines you have taken.  You have repeated (recurrent) headaches.  You feel dizzy.  You have swelling in your ankles.  You have trouble with your vision. Get help right away if:  You develop a severe headache or confusion.  You have unusual weakness or numbness, or you feel faint.  You have severe pain in your chest or abdomen.  You vomit repeatedly.  You have  trouble breathing. Summary  Hypertension is when the force of blood pumping through your arteries is too strong. If this condition is not controlled, it may put you at risk for serious complications.  Your personal target blood pressure may vary depending on your medical conditions, your age, and other factors. For most people, a normal blood pressure is less than 120/80.  Hypertension is managed by lifestyle changes, medicines, or both. Lifestyle changes include weight loss, eating a healthy, low-sodium diet, exercising more, and limiting alcohol. This information is not intended to replace advice given to you by your health care provider. Make sure you discuss any questions you have with your health care provider. Document Revised: 01/23/2019 Document Reviewed: 08/29/2016 Elsevier Patient Education  Penny Mathews.

## 2020-05-17 ENCOUNTER — Ambulatory Visit: Payer: Medicare HMO

## 2020-05-17 ENCOUNTER — Other Ambulatory Visit: Payer: Self-pay

## 2020-05-17 VITALS — BP 130/78 | HR 88 | Temp 98.0°F | Ht 64.8 in | Wt 158.0 lb

## 2020-05-17 DIAGNOSIS — I129 Hypertensive chronic kidney disease with stage 1 through stage 4 chronic kidney disease, or unspecified chronic kidney disease: Secondary | ICD-10-CM

## 2020-05-17 MED ORDER — AMLODIPINE BESYLATE 10 MG PO TABS: 10.0000 mg | ORAL_TABLET | Freq: Every day | ORAL | 1 refills | Status: DC

## 2020-05-17 MED ORDER — TELMISARTAN 40 MG PO TABS: 40.0000 mg | ORAL_TABLET | Freq: Every day | ORAL | 1 refills | Status: DC

## 2020-05-17 NOTE — Progress Notes (Signed)
Pt is here for blood pressure check. She is currently taking amlodipine 5mg  twice daily and telmesartan 20mg  twice daily   BP Readings from Last 3 Encounters:  05/17/20 130/78  05/03/20 (!) 168/90  03/31/20 (!) 162/80   Pt encouraged to increase water intake. States that she drinks about 4 bottles a day. Pt medications changed to amlodipine 10mg  in the morning and telmesartan 40mg  at bedtime. New Rx sent to pharmacy.

## 2020-05-24 ENCOUNTER — Telehealth: Payer: Self-pay

## 2020-05-24 NOTE — Telephone Encounter (Signed)
PT CALLED STATED THAT SHE WAS HAVING ISSUES WITH CONSTIPATION WANTED APPT W/[facility] WHICH IS OUT THIS WEEK ADV PT THAT NP IS AVAILABLE PT WANTED TO WAIT TILL 9/1 APPT.

## 2020-06-15 ENCOUNTER — Encounter: Payer: Self-pay | Admitting: Internal Medicine

## 2020-06-15 ENCOUNTER — Ambulatory Visit (INDEPENDENT_AMBULATORY_CARE_PROVIDER_SITE_OTHER): Payer: Medicare HMO

## 2020-06-15 ENCOUNTER — Ambulatory Visit (INDEPENDENT_AMBULATORY_CARE_PROVIDER_SITE_OTHER): Payer: Medicare HMO | Admitting: Internal Medicine

## 2020-06-15 ENCOUNTER — Other Ambulatory Visit: Payer: Self-pay

## 2020-06-15 VITALS — BP 130/72 | HR 67 | Temp 97.6°F | Ht 65.0 in | Wt 158.0 lb

## 2020-06-15 VITALS — BP 130/72 | HR 67 | Temp 97.6°F | Ht 65.0 in | Wt 158.8 lb

## 2020-06-15 DIAGNOSIS — R7309 Other abnormal glucose: Secondary | ICD-10-CM | POA: Diagnosis not present

## 2020-06-15 DIAGNOSIS — K5904 Chronic idiopathic constipation: Secondary | ICD-10-CM

## 2020-06-15 DIAGNOSIS — Z0000 Encounter for general adult medical examination without abnormal findings: Secondary | ICD-10-CM

## 2020-06-15 DIAGNOSIS — N182 Chronic kidney disease, stage 2 (mild): Secondary | ICD-10-CM

## 2020-06-15 DIAGNOSIS — Z716 Tobacco abuse counseling: Secondary | ICD-10-CM

## 2020-06-15 DIAGNOSIS — E663 Overweight: Secondary | ICD-10-CM | POA: Diagnosis not present

## 2020-06-15 DIAGNOSIS — I129 Hypertensive chronic kidney disease with stage 1 through stage 4 chronic kidney disease, or unspecified chronic kidney disease: Secondary | ICD-10-CM

## 2020-06-15 NOTE — Progress Notes (Signed)
This visit occurred during the SARS-CoV-2 public health emergency.  Safety protocols were in place, including screening questions prior to the visit, additional usage of staff PPE, and extensive cleaning of exam room while observing appropriate contact time as indicated for disinfecting solutions. Subjective:   Penny Mathews is a 86 y.o. female who presents for Medicare Annual (Subsequent) preventive examination.  Review of Systems     Cardiac Risk Factors include: advanced age (>5men, >39 women);hypertension;sedentary lifestyle;smoking/ tobacco exposure     Objective:    Today's Vitals   06/15/20 1116 06/15/20 1117  BP: 130/72   Pulse: 67   Temp: 97.6 F (36.4 C)   TempSrc: Oral   Weight: 158 lb (71.7 kg)   Height: 5\' 5"  (1.651 m)   PainSc:  3    Body mass index is 26.29 kg/m.  Advanced Directives 06/15/2020 06/30/2019 09/18/2018 11/08/2013  Does Patient Have a Medical Advance Directive? No Yes Yes Patient does not have advance directive  Type of Advance Directive - [place];Living will [place];Living will -  Does patient want to make changes to medical advance directive? - - No - Patient declined -  Copy of [place] in Chart? - No - copy requested No - copy requested -  Would patient like information on creating a medical advance directive? No - Patient declined - - -  Pre-existing out of facility DNR order (yellow form or pink MOST form) - - - No    Current Medications (verified) Outpatient Encounter Medications as of 06/15/2020  Medication Sig  . amLODipine (NORVASC) 10 MG tablet Take 1 tablet (10 mg total) by mouth daily. Take one tablet by mouth in the morning.  Marland Kitchen aspirin 81 MG tablet Take 1 tablet (81 mg total) by mouth daily.  Marland Kitchen linaclotide (LINZESS) 290 MCG CAPS capsule Take 290 mcg by mouth daily before breakfast.  . pravastatin (PRAVACHOL) 40 MG tablet Take 1 tablet (40 mg total) by mouth daily.  Marland Kitchen  telmisartan (MICARDIS) 40 MG tablet Take 1 tablet (40 mg total) by mouth at bedtime. Take one tablet by mouth daily at bedtime   No facility-administered encounter medications on file as of 06/15/2020.    Allergies (verified) Patient has no known allergies.   History: Past Medical History:  Diagnosis Date  . Cancer ([place])    Breast Ca - chemo & radiation 1990  . Hypertension    Past Surgical History:  Procedure Laterality Date  . ABDOMINAL HYSTERECTOMY    . BREAST LUMPECTOMY    . CERVICAL FUSION    . Fractured Leg x 2     Family History  Problem Relation Age of Onset  . Healthy Mother   . Heart attack Father   . Lymphoma Sister   . Cancer Sister    Social History   Socioeconomic History  . Marital status: Single    Spouse name: Not on file  . Number of children: Not on file  . Years of education: Not on file  . Highest education level: Not on file  Occupational History  . Occupation: retired  Tobacco Use  . Smoking status: Current Every Day Smoker    Packs/day: 0.12    Years: 50.00    Pack years: 6.00    Types: Cigarettes    Start date: 03/31/1961  . Smokeless tobacco: Never Used  . Tobacco comment: encouraged to cut back on number of cigs smoked/day  Vaping Use  . Vaping Use: Never  used  Substance and Sexual Activity  . Alcohol use: Yes    Comment: on occasion  . Drug use: Not Currently  . Sexual activity: Not Currently  Other Topics Concern  . Not on file  Social History Narrative  . Not on file   Social Determinants of Health   Financial Resource Strain: Low Risk   . Difficulty of Paying Living Expenses: Not hard at all  Food Insecurity: No Food Insecurity  . Worried About Charity fundraiser in the Last Year: Never true  . Ran Out of Food in the Last Year: Never true  Transportation Needs: No Transportation Needs  . Lack of Transportation (Medical): No  . Lack of Transportation (Non-Medical): No  Physical Activity: Inactive  . Days of Exercise  per Week: 0 days  . Minutes of Exercise per Session: 0 min  Stress: No Stress Concern Present  . Feeling of Stress : Not at all  Social Connections:   . Frequency of Communication with Friends and Family: Not on file  . Frequency of Social Gatherings with Friends and Family: Not on file  . Attends Religious Services: Not on file  . Active Member of Clubs or Organizations: Not on file  . Attends Archivist Meetings: Not on file  . Marital Status: Not on file    Tobacco Counseling Ready to quit: Not Answered Counseling given: Not Answered Comment: encouraged to cut back on number of cigs smoked/day   Clinical Intake:  Pre-visit preparation completed: Yes  Pain : 0-10 Pain Score: 3  Pain Type: Chronic pain Pain Location: Back Pain Orientation: Lower Pain Descriptors / Indicators: Aching Pain Onset: More than a month ago Pain Frequency: Intermittent     Nutritional Status: BMI 25 -29 Overweight Nutritional Risks: None Diabetes: No  How often do you need to have someone help you when you read instructions, pamphlets, or other written materials from your doctor or pharmacy?: 1 - Never What is the last grade level you completed in school?: 12th grade  Diabetic? no  Interpreter Needed?: No  Information entered by :: [name]   Activities of Daily Living In your present state of health, do you have any difficulty performing the following activities: 06/15/2020 06/30/2019  Hearing? N N  Vision? N N  Difficulty concentrating or making decisions? N N  Walking or climbing stairs? N N  Dressing or bathing? N N  Doing errands, shopping? N N  Preparing Food and eating ? N N  Using the Toilet? N N  In the past six months, have you accidently leaked urine? N N  Do you have problems with loss of bowel control? N N  Managing your Medications? N N  Managing your Finances? N N  Housekeeping or managing your Housekeeping? N N  Some recent data might be hidden     Patient Care Team: Glendale Chard, MD as PCP - General (Internal Medicine)  Indicate any recent Medical Services you may have received from other than [facility] providers in the past year (date may be approximate).     Assessment:   This is a routine wellness examination for Penny Mathews.  Hearing/Vision screen  Hearing Screening   125Hz  250Hz  500Hz  1000Hz  2000Hz  3000Hz  4000Hz  6000Hz  8000Hz   Right ear:           Left ear:           Vision Screening Comments: Regular eye exams, Dr. Delman Cheadle  Dietary issues and exercise activities discussed: Current Exercise Habits:  The patient does not participate in regular exercise at present  Goals    . Patient Stated     06/30/2019, no goals    . Patient Stated     06/15/2020, no goals      Depression Screen PHQ 2/9 Scores 06/15/2020 06/30/2019 12/18/2018 09/18/2018  PHQ - 2 Score 0 0 0 0  PHQ- 9 Score 0 0 - 0    Fall Risk Fall Risk  06/15/2020 06/30/2019 05/21/2019 12/18/2018 09/18/2018  Falls in the past year? 0 0 0 0 0  Risk for fall due to : Medication side effect Medication side effect - - Medication side effect  Follow up Falls evaluation completed;Education provided;Falls prevention discussed Falls evaluation completed;Education provided;Falls prevention discussed - - -    Any stairs in or around the home? No  If so, are there any without handrails? n/a Home free of loose throw rugs in walkways, pet beds, electrical cords, etc? Yes  Adequate lighting in your home to reduce risk of falls? Yes   ASSISTIVE DEVICES UTILIZED TO PREVENT FALLS:  Life alert? No  Use of a cane, walker or w/c? No  Grab bars in the bathroom? No  Shower chair or bench in shower? No  Elevated toilet seat or a handicapped toilet? No   TIMED UP AND GO:  Was the test performed? No .  Gait steady and fast without use of assistive device  Cognitive Function:     6CIT Screen 06/15/2020 06/30/2019 09/18/2018  What Year? 0 points 0 points 0 points  What month? 0 points 0 points  0 points  What time? 0 points 0 points 0 points  Count back from 20 0 points 0 points 0 points  Months in reverse 2 points 2 points 2 points  Repeat phrase 6 points 2 points 2 points  Total Score 8 4 4     Immunizations Immunization History  Administered Date(s) Administered  . PFIZER SARS-COV-2 Vaccination 12/17/2019, 01/13/2020  . Td 11/06/2005    TDAP status: Due, Education has been provided regarding the importance of this vaccine. Advised may receive this vaccine at local pharmacy or Health Dept. Aware to provide a copy of the vaccination record if obtained from local pharmacy or Health Dept. Verbalized acceptance and understanding. Flu Vaccine status: Declined, Education has been provided regarding the importance of this vaccine but patient still declined. Advised may receive this vaccine at local pharmacy or Health Dept. Aware to provide a copy of the vaccination record if obtained from local pharmacy or Health Dept. Verbalized acceptance and understanding. Pneumococcal vaccine status: Declined,  Education has been provided regarding the importance of this vaccine but patient still declined. Advised may receive this vaccine at local pharmacy or Health Dept. Aware to provide a copy of the vaccination record if obtained from local pharmacy or Health Dept. Verbalized acceptance and understanding.  Covid-19 vaccine status: Completed vaccines  Qualifies for Shingles Vaccine? Yes   Zostavax completed No   Shingrix Completed?: No.    Education has been provided regarding the importance of this vaccine. Patient has been advised to call insurance company to determine out of pocket expense if they have not yet received this vaccine. Advised may also receive vaccine at local pharmacy or Health Dept. Verbalized acceptance and understanding.  Screening Tests Health Maintenance  Topic Date Due  . TETANUS/TDAP  09/29/2020 (Originally 11/07/2015)  . PNA vac Low Risk Adult (1 of 2 - PCV13) 09/29/2020  (Originally 08/18/2001)  . INFLUENZA VACCINE  01/12/2021 (Originally  05/15/2020)  . COVID-19 Vaccine  Completed  . DEXA SCAN  Discontinued    Health Maintenance  There are no preventive care reminders to display for this patient.  Colorectal cancer screening: No longer required.  Mammogram status: No longer required.  Bone Density status: Decline  Lung Cancer Screening: (Low Dose CT Chest recommended if Age 10-80 years, 30 pack-year currently smoking OR have quit w/in 15years.) does not qualify.   Lung Cancer Screening Referral: no  Additional Screening:  Hepatitis C Screening: does not qualify;   Vision Screening: Recommended annual ophthalmology exams for early detection of glaucoma and other disorders of the eye. Is the patient up to date with their annual eye exam?  Yes  Who is the provider or what is the name of the office in which the patient attends annual eye exams? Dr. Delman Cheadle If pt is not established with a provider, would they like to be referred to a provider to establish care? No .   Dental Screening: Recommended annual dental exams for proper oral hygiene  Community Resource Referral / Chronic Care Management: CRR required this visit?  No   CCM required this visit?  No      Plan:     I have personally reviewed and noted the following in the patient's chart:   . Medical and social history . Use of alcohol, tobacco or illicit drugs  . Current medications and supplements . Functional ability and status . Nutritional status . Physical activity . Advanced directives . List of other physicians . Hospitalizations, surgeries, and ER visits in previous 12 months . Vitals . Screenings to include cognitive, depression, and falls . Referrals and appointments  In addition, I have reviewed and discussed with patient certain preventive protocols, quality metrics, and best practice recommendations. A written personalized care plan for preventive services as well as  general preventive health recommendations were provided to patient.     Kellie Simmering, LPN   01/21/7025   Nurse Notes:

## 2020-06-15 NOTE — Patient Instructions (Signed)
[name] , Thank you for taking time to come for your Medicare Wellness Visit. I appreciate your ongoing commitment to your health goals. Please review the following plan we discussed and let me know if I can assist you in the future.   Screening recommendations/referrals: Colonoscopy: not required Mammogram: not required Bone Density: decline Recommended yearly ophthalmology/optometry visit for glaucoma screening and checkup Recommended yearly dental visit for hygiene and checkup  Vaccinations: Influenza vaccine: decline Pneumococcal vaccine: decline Tdap vaccine: decline Shingles vaccine: decline   Covid-19: 01/13/2020, 12/17/2019  Advanced directives: Advance directive discussed with you today. Even though you declined this today please call our office should you change your mind and we can give you the proper paperwork for you to fill out.   Conditions/risks identified: smoking  Next appointment: 10/26/2020 at 9:15 Follow up in one year for your annual wellness visit    Preventive Care 57 Years and Older, Female Preventive care refers to lifestyle choices and visits with your health care provider that can promote health and wellness. What does preventive care include?  A yearly physical exam. This is also called an annual well check.  Dental exams once or twice a year.  Routine eye exams. Ask your health care provider how often you should have your eyes checked.  Personal lifestyle choices, including:  Daily care of your teeth and gums.  Regular physical activity.  Eating a healthy diet.  Avoiding tobacco and drug use.  Limiting alcohol use.  Practicing safe sex.  Taking low-dose aspirin every day.  Taking vitamin and mineral supplements as recommended by your health care provider. What happens during an annual well check? The services and screenings done by your health care provider during your annual well check will depend on your age, overall health,  lifestyle risk factors, and family history of disease. Counseling  Your health care provider may ask you questions about your:  Alcohol use.  Tobacco use.  Drug use.  Emotional well-being.  Home and relationship well-being.  Sexual activity.  Eating habits.  History of falls.  Memory and ability to understand (cognition).  Work and work Statistician.  Reproductive health. Screening  You may have the following tests or measurements:  Height, weight, and BMI.  Blood pressure.  Lipid and cholesterol levels. These may be checked every 5 years, or more frequently if you are over 63 years old.  Skin check.  Lung cancer screening. You may have this screening every year starting at age 6 if you have a 30-pack-year history of smoking and currently smoke or have quit within the past 15 years.  Fecal occult blood test (FOBT) of the stool. You may have this test every year starting at age 22.  Flexible sigmoidoscopy or colonoscopy. You may have a sigmoidoscopy every 5 years or a colonoscopy every 10 years starting at age 22.  Hepatitis C blood test.  Hepatitis B blood test.  Sexually transmitted disease (STD) testing.  Diabetes screening. This is done by checking your blood sugar (glucose) after you have not eaten for a while (fasting). You may have this done every 1-3 years.  Bone density scan. This is done to screen for osteoporosis. You may have this done starting at age 65.  Mammogram. This may be done every 1-2 years. Talk to your health care provider about how often you should have regular mammograms. Talk with your health care provider about your test results, treatment options, and if necessary, the need for more tests. Vaccines  Your  health care provider may recommend certain vaccines, such as:  Influenza vaccine. This is recommended every year.  Tetanus, diphtheria, and acellular pertussis (Tdap, Td) vaccine. You may need a Td booster every 10 years.  Zoster  vaccine. You may need this after age 62.  Pneumococcal 13-valent conjugate (PCV13) vaccine. One dose is recommended after age 56.  Pneumococcal polysaccharide (PPSV23) vaccine. One dose is recommended after age 68. Talk to your health care provider about which screenings and vaccines you need and how often you need them. This information is not intended to replace advice given to you by your health care provider. Make sure you discuss any questions you have with your health care provider. Document Released: 10/28/2015 Document Revised: 06/20/2016 Document Reviewed: 08/02/2015 Elsevier Interactive Patient Education  2017 [place] Prevention in the Home Falls can cause injuries. They can happen to people of all ages. There are many things you can do to make your home safe and to help prevent falls. What can I do on the outside of my home?  Regularly fix the edges of walkways and driveways and fix any cracks.  Remove anything that might make you trip as you walk through a door, such as a raised step or threshold.  Trim any bushes or trees on the path to your home.  Use bright outdoor lighting.  Clear any walking paths of anything that might make someone trip, such as rocks or tools.  Regularly check to see if handrails are loose or broken. Make sure that both sides of any steps have handrails.  Any raised decks and porches should have guardrails on the edges.  Have any leaves, snow, or ice cleared regularly.  Use sand or salt on walking paths during winter.  Clean up any spills in your garage right away. This includes oil or grease spills. What can I do in the bathroom?  Use night lights.  Install grab bars by the toilet and in the tub and shower. Do not use towel bars as grab bars.  Use non-skid mats or decals in the tub or shower.  If you need to sit down in the shower, use a plastic, non-slip stool.  Keep the floor dry. Clean up any water that spills on the  floor as soon as it happens.  Remove soap buildup in the tub or shower regularly.  Attach bath mats securely with double-sided non-slip rug tape.  Do not have throw rugs and other things on the floor that can make you trip. What can I do in the bedroom?  Use night lights.  Make sure that you have a light by your bed that is easy to reach.  Do not use any sheets or blankets that are too big for your bed. They should not hang down onto the floor.  Have a firm chair that has side arms. You can use this for support while you get dressed.  Do not have throw rugs and other things on the floor that can make you trip. What can I do in the kitchen?  Clean up any spills right away.  Avoid walking on wet floors.  Keep items that you use a lot in easy-to-reach places.  If you need to reach something above you, use a strong step stool that has a grab bar.  Keep electrical cords out of the way.  Do not use floor polish or wax that makes floors slippery. If you must use wax, use non-skid floor wax.  Do not  have throw rugs and other things on the floor that can make you trip. What can I do with my stairs?  Do not leave any items on the stairs.  Make sure that there are handrails on both sides of the stairs and use them. Fix handrails that are broken or loose. Make sure that handrails are as long as the stairways.  Check any carpeting to make sure that it is firmly attached to the stairs. Fix any carpet that is loose or worn.  Avoid having throw rugs at the top or bottom of the stairs. If you do have throw rugs, attach them to the floor with carpet tape.  Make sure that you have a light switch at the top of the stairs and the bottom of the stairs. If you do not have them, ask someone to add them for you. What else can I do to help prevent falls?  Wear shoes that:  Do not have high heels.  Have rubber bottoms.  Are comfortable and fit you well.  Are closed at the toe. Do not wear  sandals.  If you use a stepladder:  Make sure that it is fully opened. Do not climb a closed stepladder.  Make sure that both sides of the stepladder are locked into place.  Ask someone to hold it for you, if possible.  Clearly mark and make sure that you can see:  Any grab bars or handrails.  First and last steps.  Where the edge of each step is.  Use tools that help you move around (mobility aids) if they are needed. These include:  Canes.  Walkers.  Scooters.  Crutches.  Turn on the lights when you go into a dark area. Replace any light bulbs as soon as they burn out.  Set up your furniture so you have a clear path. Avoid moving your furniture around.  If any of your floors are uneven, fix them.  If there are any pets around you, be aware of where they are.  Review your medicines with your doctor. Some medicines can make you feel dizzy. This can increase your chance of falling. Ask your doctor what other things that you can do to help prevent falls. This information is not intended to replace advice given to you by your health care provider. Make sure you discuss any questions you have with your health care provider. Document Released: 07/28/2009 Document Revised: 03/08/2016 Document Reviewed: 11/05/2014 Elsevier Interactive Patient Education  2017 [employer].

## 2020-06-15 NOTE — Patient Instructions (Signed)
   Managing Your Hypertension Hypertension is commonly called high blood pressure. This is when the force of your blood pressing against the walls of your arteries is too strong. Arteries are blood vessels that carry blood from your heart throughout your body. Hypertension forces the heart to work harder to pump blood, and may cause the arteries to become narrow or stiff. Having untreated or uncontrolled hypertension can cause heart attack, stroke, kidney disease, and other problems. What are blood pressure readings? A blood pressure reading consists of a higher number over a lower number. Ideally, your blood pressure should be below 120/80. The first ("top") number is called the systolic pressure. It is a measure of the pressure in your arteries as your heart beats. The second ("bottom") number is called the diastolic pressure. It is a measure of the pressure in your arteries as the heart relaxes. What does my blood pressure reading mean? Blood pressure is classified into four stages. Based on your blood pressure reading, your health care provider may use the following stages to determine what type of treatment you need, if any. Systolic pressure and diastolic pressure are measured in a unit called mm Hg. Normal  Systolic pressure: below 120.  Diastolic pressure: below 80. Elevated  Systolic pressure: 120-129.  Diastolic pressure: below 80. Hypertension stage 1  Systolic pressure: 130-139.  Diastolic pressure: 80-89. Hypertension stage 2  Systolic pressure: 140 or above.  Diastolic pressure: 90 or above. What health risks are associated with hypertension? Managing your hypertension is an important responsibility. Uncontrolled hypertension can lead to:  A heart attack.  A stroke.  A weakened blood vessel (aneurysm).  Heart failure.  Kidney damage.  Eye damage.  Metabolic syndrome.  Memory and concentration problems. What changes can I make to manage my  hypertension? Hypertension can be managed by making lifestyle changes and possibly by taking medicines. Your health care provider will help you make a plan to bring your blood pressure within a normal range. Eating and drinking   Eat a diet that is high in fiber and potassium, and low in salt (sodium), added sugar, and fat. An example eating plan is called the DASH (Dietary Approaches to Stop Hypertension) diet. To eat this way: ? Eat plenty of fresh fruits and vegetables. Try to fill half of your plate at each meal with fruits and vegetables. ? Eat whole grains, such as whole wheat pasta, brown rice, or whole grain bread. Fill about one quarter of your plate with whole grains. ? Eat low-fat diary products. ? Avoid fatty cuts of meat, processed or cured meats, and poultry with skin. Fill about one quarter of your plate with lean proteins such as fish, chicken without skin, beans, eggs, and tofu. ? Avoid premade and processed foods. These tend to be higher in sodium, added sugar, and fat.  Reduce your daily sodium intake. Most people with hypertension should eat less than 1,500 mg of sodium a day.  Limit alcohol intake to no more than 1 drink a day for nonpregnant women and 2 drinks a day for men. One drink equals 12 oz of beer, 5 oz of wine, or 1 oz of hard liquor. Lifestyle  Work with your health care provider to maintain a healthy body weight, or to lose weight. Ask what an ideal weight is for you.  Get at least 30 minutes of exercise that causes your heart to beat faster (aerobic exercise) most days of the week. Activities may include walking, swimming, or biking.    Include exercise to strengthen your muscles (resistance exercise), such as weight lifting, as part of your weekly exercise routine. Try to do these types of exercises for 30 minutes at least 3 days a week.  Do not use any products that contain nicotine or tobacco, such as cigarettes and e-cigarettes. If you need help quitting,  ask your health care provider.  Control any long-term (chronic) conditions you have, such as high cholesterol or diabetes. Monitoring  Monitor your blood pressure at home as told by your health care provider. Your personal target blood pressure may vary depending on your medical conditions, your age, and other factors.  Have your blood pressure checked regularly, as often as told by your health care provider. Working with your health care provider  Review all the medicines you take with your health care provider because there may be side effects or interactions.  Talk with your health care provider about your diet, exercise habits, and other lifestyle factors that may be contributing to hypertension.  Visit your health care provider regularly. Your health care provider can help you create and adjust your plan for managing hypertension. Will I need medicine to control my blood pressure? Your health care provider may prescribe medicine if lifestyle changes are not enough to get your blood pressure under control, and if:  Your systolic blood pressure is 130 or higher.  Your diastolic blood pressure is 80 or higher. Take medicines only as told by your health care provider. Follow the directions carefully. Blood pressure medicines must be taken as prescribed. The medicine does not work as well when you skip doses. Skipping doses also puts you at risk for problems. Contact a health care provider if:  You think you are having a reaction to medicines you have taken.  You have repeated (recurrent) headaches.  You feel dizzy.  You have swelling in your ankles.  You have trouble with your vision. Get help right away if:  You develop a severe headache or confusion.  You have unusual weakness or numbness, or you feel faint.  You have severe pain in your chest or abdomen.  You vomit repeatedly.  You have trouble breathing. Summary  Hypertension is when the force of blood pumping  through your arteries is too strong. If this condition is not controlled, it may put you at risk for serious complications.  Your personal target blood pressure may vary depending on your medical conditions, your age, and other factors. For most people, a normal blood pressure is less than 120/80.  Hypertension is managed by lifestyle changes, medicines, or both. Lifestyle changes include weight loss, eating a healthy, low-sodium diet, exercising more, and limiting alcohol. This information is not intended to replace advice given to you by your health care provider. Make sure you discuss any questions you have with your health care provider. Document Revised: 01/23/2019 Document Reviewed: 08/29/2016 Elsevier Patient Education  2020 [employer]  

## 2020-06-15 NOTE — Progress Notes (Signed)
I,Tianna Badgett,acting as a Education administrator for Maximino Greenland, MD.,have documented all relevant documentation on the behalf of Maximino Greenland, MD,as directed by  Maximino Greenland, MD while in the presence of Maximino Greenland, MD.  This visit occurred during the SARS-CoV-2 public health emergency.  Safety protocols were in place, including screening questions prior to the visit, additional usage of staff PPE, and extensive cleaning of exam room while observing appropriate contact time as indicated for disinfecting solutions.  Subjective:     Patient ID: Penny Mathews , female    DOB: Jul 22, 1934 , 86 y.o.   MRN: [id number]   Chief Complaint  Patient presents with  . Hypertension    HPI  She presents today for BP check . She reports compliance with meds.  She reports she has changed her eating habits and has tried to incorporate more actiivty into her daily routine.   Hypertension This is a chronic problem. The current episode started more than 1 year ago. The problem has been gradually worsening since onset. The problem is controlled. Pertinent negatives include no blurred vision. Risk factors for coronary artery disease include dyslipidemia and post-menopausal state. Hypertensive end-organ damage includes kidney disease.     Past Medical History:  Diagnosis Date  . Cancer ([place])    Breast Ca - chemo & radiation 1990  . Hypertension      Family History  Problem Relation Age of Onset  . Healthy Mother   . Heart attack Father   . Lymphoma Sister   . Cancer Sister      Current Outpatient Medications:  .  amLODipine (NORVASC) 10 MG tablet, Take 1 tablet (10 mg total) by mouth daily. Take one tablet by mouth in the morning., Disp: 90 tablet, Rfl: 1 .  aspirin 81 MG tablet, Take 1 tablet (81 mg total) by mouth daily., Disp: 30 tablet, Rfl:  .  linaclotide (LINZESS) 290 MCG CAPS capsule, Take 290 mcg by mouth daily before breakfast., Disp: , Rfl:  .  pravastatin (PRAVACHOL) 40 MG tablet,  Take 1 tablet (40 mg total) by mouth daily., Disp: 90 tablet, Rfl: 2 .  telmisartan (MICARDIS) 40 MG tablet, Take 1 tablet (40 mg total) by mouth at bedtime. Take one tablet by mouth daily at bedtime, Disp: 90 tablet, Rfl: 1   No Known Allergies   Review of Systems  Constitutional: Negative.   Eyes: Negative for blurred vision.  Respiratory: Negative.   Cardiovascular: Negative.   Gastrointestinal: Negative.   Neurological: Negative.      Today's Vitals   06/15/20 1033  BP: 130/72  Pulse: 67  Temp: 97.6 F (36.4 C)  TempSrc: Oral  Weight: 158 lb 12.8 oz (72 kg)  Height: 5' 5"  (1.651 m)   Body mass index is 26.43 kg/m.   Objective:  Physical Exam Vitals and nursing note reviewed.  Constitutional:      Appearance: Normal appearance.  HENT:     Head: Normocephalic and atraumatic.  Cardiovascular:     Rate and Rhythm: Normal rate and regular rhythm.     Heart sounds: Normal heart sounds.  Pulmonary:     Effort: Pulmonary effort is normal.     Breath sounds: Normal breath sounds.  Abdominal:     General: Bowel sounds are normal. There is no distension.     Palpations: Abdomen is soft.     Tenderness: There is no abdominal tenderness.  Skin:    General: Skin is warm.  Neurological:  General: No focal deficit present.     Mental Status: She is alert.  Psychiatric:        Mood and Affect: Mood normal.        Behavior: Behavior normal.         Assessment And Plan:     1. Hypertensive nephropathy Comments: Chronic, controlled. She will continue with current medication. Encouraged to avoid adding salt to her foods. I will check renal function today.  - BMP8+EGFR  2. Chronic renal disease, stage II Comments: Chronic, this has been stable. Encouraged to stay well hydrated. She was reminded of importance to keep BP well controlled to avoid further progression of CKD.   3. Other abnormal glucose Comments: This has been elevated in the past. Encouraged to avoid  sugary beverages including diet.  - Hemoglobin A1c  4. Chronic idiopathic constipation Comments: Chronic, unfortunately she has not had great improvements in her sx with Linzess. I will give her samples of Trulance to try when they become available. I plan to refer her to GI if her sx persist.    5. Encounter for tobacco use cessation counseling  Smoking cessation instruction/counseling given:  counseled patient on the dangers of tobacco use, advised patient to stop smoking, and reviewed strategies to maximize success   6. Overweight with body mass index (BMI) 25.0-29.9 Comments: Her weight is stable for her demographic. Encouraged to aim for at least 150 minutes of exercise per week.   Wt Readings from Last 3 Encounters:  06/15/20 158 lb 12.8 oz (72 kg)  06/15/20 158 lb (71.7 kg)  05/17/20 158 lb (71.7 kg)      Patient was given opportunity to ask questions. Patient verbalized understanding of the plan and was able to repeat key elements of the plan. All questions were answered to their satisfaction.  Maximino Greenland, MD   I, Maximino Greenland, MD, have reviewed all documentation for this visit. The documentation on 06/20/20 for the exam, diagnosis, procedures, and orders are all accurate and complete.  THE PATIENT IS ENCOURAGED TO PRACTICE SOCIAL DISTANCING DUE TO THE COVID-19 PANDEMIC.

## 2020-06-16 LAB — BMP8+EGFR
BUN/Creatinine Ratio: 11 — ABNORMAL LOW (ref 12–28)
BUN: 10 mg/dL (ref 8–27)
CO2: 23 mmol/L (ref 20–29)
Calcium: 9.5 mg/dL (ref 8.7–10.3)
Chloride: 101 mmol/L (ref 96–106)
Creatinine, Ser: 0.91 mg/dL (ref 0.57–1.00)
GFR calc Af Amer: 67 mL/min/{1.73_m2} (ref >59)
GFR calc non Af Amer: 59 mL/min/{1.73_m2} — ABNORMAL LOW (ref >59)
Glucose: 89 mg/dL (ref 65–99)
Potassium: 3.7 mmol/L (ref 3.5–5.2)
Sodium: 140 mmol/L (ref 134–144)

## 2020-06-16 LAB — HEMOGLOBIN A1C
Est. average glucose Bld gHb Est-mCnc: 134 mg/dL
Hgb A1c MFr Bld: 6.3 % — ABNORMAL HIGH (ref 4.8–5.6)

## 2020-07-13 ENCOUNTER — Ambulatory Visit: Payer: Medicare HMO | Admitting: Internal Medicine

## 2020-08-18 DIAGNOSIS — H524 Presbyopia: Secondary | ICD-10-CM | POA: Diagnosis not present

## 2020-08-18 DIAGNOSIS — H2513 Age-related nuclear cataract, bilateral: Secondary | ICD-10-CM | POA: Diagnosis not present

## 2020-08-18 DIAGNOSIS — H5203 Hypermetropia, bilateral: Secondary | ICD-10-CM | POA: Diagnosis not present

## 2020-08-18 DIAGNOSIS — H52203 Unspecified astigmatism, bilateral: Secondary | ICD-10-CM | POA: Diagnosis not present

## 2020-10-03 DIAGNOSIS — H2513 Age-related nuclear cataract, bilateral: Secondary | ICD-10-CM | POA: Diagnosis not present

## 2020-10-26 ENCOUNTER — Other Ambulatory Visit: Payer: Self-pay

## 2020-10-26 ENCOUNTER — Encounter: Payer: Self-pay | Admitting: Internal Medicine

## 2020-10-26 ENCOUNTER — Ambulatory Visit (INDEPENDENT_AMBULATORY_CARE_PROVIDER_SITE_OTHER): Payer: Medicare HMO | Admitting: Internal Medicine

## 2020-10-26 VITALS — BP 144/90 | HR 80 | Temp 97.9°F | Ht 65.0 in | Wt 157.0 lb

## 2020-10-26 DIAGNOSIS — H6122 Impacted cerumen, left ear: Secondary | ICD-10-CM

## 2020-10-26 DIAGNOSIS — Z0000 Encounter for general adult medical examination without abnormal findings: Secondary | ICD-10-CM

## 2020-10-26 DIAGNOSIS — R7309 Other abnormal glucose: Secondary | ICD-10-CM

## 2020-10-26 DIAGNOSIS — I129 Hypertensive chronic kidney disease with stage 1 through stage 4 chronic kidney disease, or unspecified chronic kidney disease: Secondary | ICD-10-CM

## 2020-10-26 DIAGNOSIS — Z716 Tobacco abuse counseling: Secondary | ICD-10-CM

## 2020-10-26 DIAGNOSIS — E559 Vitamin D deficiency, unspecified: Secondary | ICD-10-CM | POA: Diagnosis not present

## 2020-10-26 DIAGNOSIS — N182 Chronic kidney disease, stage 2 (mild): Secondary | ICD-10-CM | POA: Diagnosis not present

## 2020-10-26 DIAGNOSIS — K5904 Chronic idiopathic constipation: Secondary | ICD-10-CM
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
## 2020-10-26 NOTE — Patient Instructions (Signed)
Hypertension, Adult Hypertension is another name for high blood pressure. High blood pressure forces your heart to work harder to pump blood. This can cause problems over time. There are two numbers in a blood pressure reading. There is a top number (systolic) over a bottom number (diastolic). It is best to have a blood pressure that is below 120/80. Healthy choices can help lower your blood pressure, or you may need medicine to help lower it. What are the causes? The cause of this condition is not known. Some conditions may be related to high blood pressure. What increases the risk?  Smoking.  Having type 2 diabetes mellitus, high cholesterol, or both.  Not getting enough exercise or physical activity.  Being overweight.  Having too much fat, sugar, calories, or salt (sodium) in your diet.  Drinking too much alcohol.  Having long-term (chronic) kidney disease.  Having a family history of high blood pressure.  Age. Risk increases with age.  Race. You may be at higher risk if you are African American.  Gender. Men are at higher risk than women before age 45. After age 65, women are at higher risk than men.  Having obstructive sleep apnea.  Stress. What are the signs or symptoms?  High blood pressure may not cause symptoms. Very high blood pressure (hypertensive crisis) may cause: ? Headache. ? Feelings of worry or nervousness (anxiety). ? Shortness of breath. ? Nosebleed. ? A feeling of being sick to your stomach (nausea). ? Throwing up (vomiting). ? Changes in how you see. ? Very bad chest pain. ? Seizures. How is this treated?  This condition is treated by making healthy lifestyle changes, such as: ? Eating healthy foods. ? Exercising more. ? Drinking less alcohol.  Your health care provider may prescribe medicine if lifestyle changes are not enough to get your blood pressure under control, and if: ? Your top number is above 130. ? Your bottom number is above  80.  Your personal target blood pressure may vary. Follow these instructions at home: Eating and drinking  If told, follow the DASH eating plan. To follow this plan: ? Fill one half of your plate at each meal with fruits and vegetables. ? Fill one fourth of your plate at each meal with whole grains. Whole grains include whole-wheat pasta, brown rice, and whole-grain bread. ? Eat or drink low-fat dairy products, such as skim milk or low-fat yogurt. ? Fill one fourth of your plate at each meal with low-fat (lean) proteins. Low-fat proteins include fish, chicken without skin, eggs, beans, and tofu. ? Avoid fatty meat, cured and processed meat, or chicken with skin. ? Avoid pre-made or processed food.  Eat less than 1,500 mg of salt each day.  Do not drink alcohol if: ? Your doctor tells you not to drink. ? You are pregnant, may be pregnant, or are planning to become pregnant.  If you drink alcohol: ? Limit how much you use to:  0-1 drink a day for women.  0-2 drinks a day for men. ? Be aware of how much alcohol is in your drink. In the [place] one drink equals one 12 oz bottle of beer (355 mL), one 5 oz glass of wine (148 mL), or one 1 oz glass of hard liquor (44 mL).   Lifestyle  Work with your doctor to stay at a healthy weight or to lose weight. Ask your doctor what the best weight is for you.  Get at least 30 minutes of exercise most   days of the week. This may include walking, swimming, or biking.  Get at least 30 minutes of exercise that strengthens your muscles (resistance exercise) at least 3 days a week. This may include lifting weights or doing Pilates.  Do not use any products that contain nicotine or tobacco, such as cigarettes, e-cigarettes, and chewing tobacco. If you need help quitting, ask your doctor.  Check your blood pressure at home as told by your doctor.  Keep all follow-up visits as told by your doctor. This is important.   Medicines  Take over-the-counter  and prescription medicines only as told by your doctor. Follow directions carefully.  Do not skip doses of blood pressure medicine. The medicine does not work as well if you skip doses. Skipping doses also puts you at risk for problems.  Ask your doctor about side effects or reactions to medicines that you should watch for. Contact a doctor if you:  Think you are having a reaction to the medicine you are taking.  Have headaches that keep coming back (recurring).  Feel dizzy.  Have swelling in your ankles.  Have trouble with your vision. Get help right away if you:  Get a very bad headache.  Start to feel mixed up (confused).  Feel weak or numb.  Feel faint.  Have very bad pain in your: ? Chest. ? Belly (abdomen).  Throw up more than once.  Have trouble breathing. Summary  Hypertension is another name for high blood pressure.  High blood pressure forces your heart to work harder to pump blood.  For most people, a normal blood pressure is less than 120/80.  Making healthy choices can help lower blood pressure. If your blood pressure does not get lower with healthy choices, you may need to take medicine. This information is not intended to replace advice given to you by your health care provider. Make sure you discuss any questions you have with your health care provider. Document Revised: 06/11/2018 Document Reviewed: 06/11/2018 Elsevier Patient Education  2021 [employer] Health Maintenance, Female Adopting a healthy lifestyle and getting preventive care are important in promoting health and wellness. Ask your health care provider about:  The right schedule for you to have regular tests and exams.  Things you can do on your own to prevent diseases and keep yourself healthy. What should I know about diet, weight, and exercise? Eat a healthy diet  Eat a diet that includes plenty of vegetables, fruits, low-fat dairy products, and lean protein.  Do not eat a lot  of foods that are high in solid fats, added sugars, or sodium.   Maintain a healthy weight Body mass index (BMI) is used to identify weight problems. It estimates body fat based on height and weight. Your health care provider can help determine your BMI and help you achieve or maintain a healthy weight. Get regular exercise Get regular exercise. This is one of the most important things you can do for your health. Most adults should:  Exercise for at least 150 minutes each week. The exercise should increase your heart rate and make you sweat (moderate-intensity exercise).  Do strengthening exercises at least twice a week. This is in addition to the moderate-intensity exercise.  Spend less time sitting. Even light physical activity can be beneficial. Watch cholesterol and blood lipids Have your blood tested for lipids and cholesterol at 85 years of age, then have this test every 5 years. Have your cholesterol levels checked more often if:  Your lipid or   cholesterol levels are high.  You are older than 85 years of age.  You are at high risk for heart disease. What should I know about cancer screening? Depending on your health history and family history, you may need to have cancer screening at various ages. This may include screening for:  Breast cancer.  Cervical cancer.  Colorectal cancer.  Skin cancer.  Lung cancer. What should I know about heart disease, diabetes, and high blood pressure? Blood pressure and heart disease  High blood pressure causes heart disease and increases the risk of stroke. This is more likely to develop in people who have high blood pressure readings, are of African descent, or are overweight.  Have your blood pressure checked: ? Every 3-5 years if you are 18-39 years of age. ? Every year if you are 40 years old or older. Diabetes Have regular diabetes screenings. This checks your fasting blood sugar level. Have the screening done:  Once every three  years after age 40 if you are at a normal weight and have a low risk for diabetes.  More often and at a younger age if you are overweight or have a high risk for diabetes. What should I know about preventing infection? Hepatitis B If you have a higher risk for hepatitis B, you should be screened for this virus. Talk with your health care provider to find out if you are at risk for hepatitis B infection. Hepatitis C Testing is recommended for:  Everyone born from 1945 through 1965.  Anyone with known risk factors for hepatitis C. Sexually transmitted infections (STIs)  Get screened for STIs, including gonorrhea and chlamydia, if: ? You are sexually active and are younger than 85 years of age. ? You are older than 85 years of age and your health care provider tells you that you are at risk for this type of infection. ? Your sexual activity has changed since you were last screened, and you are at increased risk for chlamydia or gonorrhea. Ask your health care provider if you are at risk.  Ask your health care provider about whether you are at high risk for HIV. Your health care provider may recommend a prescription medicine to help prevent HIV infection. If you choose to take medicine to prevent HIV, you should first get tested for HIV. You should then be tested every 3 months for as long as you are taking the medicine. Pregnancy  If you are about to stop having your period (premenopausal) and you may become pregnant, seek counseling before you get pregnant.  Take 400 to 800 micrograms (mcg) of folic acid every day if you become pregnant.  Ask for birth control (contraception) if you want to prevent pregnancy. Osteoporosis and menopause Osteoporosis is a disease in which the bones lose minerals and strength with aging. This can result in bone fractures. If you are 65 years old or older, or if you are at risk for osteoporosis and fractures, ask your health care provider if you should:  Be  screened for bone loss.  Take a calcium or vitamin D supplement to lower your risk of fractures.  Be given hormone replacement therapy (HRT) to treat symptoms of menopause. Follow these instructions at home: Lifestyle  Do not use any products that contain nicotine or tobacco, such as cigarettes, e-cigarettes, and chewing tobacco. If you need help quitting, ask your health care provider.  Do not use street drugs.  Do not share needles.  Ask your health care provider for   help if you need support or information about quitting drugs. Alcohol use  Do not drink alcohol if: ? Your health care provider tells you not to drink. ? You are pregnant, may be pregnant, or are planning to become pregnant.  If you drink alcohol: ? Limit how much you use to 0-1 drink a day. ? Limit intake if you are breastfeeding.  Be aware of how much alcohol is in your drink. In the [place] one drink equals one 12 oz bottle of beer (355 mL), one 5 oz glass of wine (148 mL), or one 1 oz glass of hard liquor (44 mL). General instructions  Schedule regular health, dental, and eye exams.  Stay current with your vaccines.  Tell your health care provider if: ? You often feel depressed. ? You have ever been abused or do not feel safe at home. Summary  Adopting a healthy lifestyle and getting preventive care are important in promoting health and wellness.  Follow your health care provider's instructions about healthy diet, exercising, and getting tested or screened for diseases.  Follow your health care provider's instructions on monitoring your cholesterol and blood pressure. This information is not intended to replace advice given to you by your health care provider. Make sure you discuss any questions you have with your health care provider. Document Revised: 09/24/2018 Document Reviewed: 09/24/2018 Elsevier Patient Education  2021 Elsevier Inc.  

## 2020-10-27 LAB — CBC
Hematocrit: 45.5 % (ref 34.0–46.6)
Hemoglobin: 14.9 g/dL (ref 11.1–15.9)
MCH: 29.9 pg (ref 26.6–33.0)
MCHC: 32.7 g/dL (ref 31.5–35.7)
MCV: 91 fL (ref 79–97)
Platelets: 256 10*3/uL (ref 150–450)
RBC: 4.99 x10E6/uL (ref 3.77–5.28)
RDW: 13 % (ref 11.7–15.4)
WBC: 5.3 10*3/uL (ref 3.4–10.8)

## 2020-10-27 LAB — HEMOGLOBIN A1C
Est. average glucose Bld gHb Est-mCnc: 134 mg/dL
Hgb A1c MFr Bld: 6.3 % — ABNORMAL HIGH (ref 4.8–5.6)

## 2020-10-27 LAB — CMP14+EGFR
ALT: 15 IU/L (ref 0–32)
AST: 21 IU/L (ref 0–40)
Albumin/Globulin Ratio: 1.8 (ref 1.2–2.2)
Albumin: 4.6 g/dL (ref 3.6–4.6)
Alkaline Phosphatase: 101 IU/L (ref 44–121)
BUN/Creatinine Ratio: 7 — ABNORMAL LOW (ref 12–28)
BUN: 7 mg/dL — ABNORMAL LOW (ref 8–27)
Bilirubin Total: 0.4 mg/dL (ref 0.0–1.2)
CO2: 28 mmol/L (ref 20–29)
Calcium: 9.6 mg/dL (ref 8.7–10.3)
Chloride: 102 mmol/L (ref 96–106)
Creatinine, Ser: 0.99 mg/dL (ref 0.57–1.00)
GFR calc Af Amer: 61 mL/min/{1.73_m2} (ref >59)
GFR calc non Af Amer: 52 mL/min/{1.73_m2} — ABNORMAL LOW (ref >59)
Globulin, Total: 2.5 g/dL (ref 1.5–4.5)
Glucose: 100 mg/dL — ABNORMAL HIGH (ref 65–99)
Potassium: 4.4 mmol/L (ref 3.5–5.2)
Sodium: 143 mmol/L (ref 134–144)
Total Protein: 7.1 g/dL (ref 6.0–8.5)

## 2020-10-27 LAB — LIPID PANEL
Chol/HDL Ratio: 2.4 ratio (ref 0.0–4.4)
Cholesterol, Total: 248 mg/dL — ABNORMAL HIGH (ref 100–199)
HDL: 102 mg/dL (ref >39)
LDL Chol Calc (NIH): 121 mg/dL — ABNORMAL HIGH (ref 0–99)
Triglycerides: 149 mg/dL (ref 0–149)
VLDL Cholesterol Cal: 25 mg/dL (ref 5–40)

## 2020-10-27 LAB — VITAMIN D 25 HYDROXY (VIT D DEFICIENCY, FRACTURES): Vit D, 25-Hydroxy: 32.7 ng/mL (ref 30.0–100.0)

## 2020-11-01 ENCOUNTER — Other Ambulatory Visit: Payer: Self-pay

## 2020-11-01 MED ORDER — VITAMIN D3 50 MCG (2000 UT) PO CAPS: 2000.0000 [IU] | ORAL_CAPSULE | Freq: Every day | ORAL | 3 refills | Status: AC

## 2020-11-02 LAB — POCT URINALYSIS DIPSTICK
Bilirubin, UA: NEGATIVE
Glucose, UA: NEGATIVE
Ketones, UA: NEGATIVE
Nitrite, UA: NEGATIVE
Protein, UA: NEGATIVE
Spec Grav, UA: 1.02 (ref 1.010–1.025)
Urobilinogen, UA: 0.2 E.U./dL
pH, UA: 8.5 — AB (ref 5.0–8.0)

## 2020-11-02 LAB — POCT UA - MICROALBUMIN
Albumin/Creatinine Ratio, Urine, POC: 30
Creatinine, POC: 200 mg/dL
Microalbumin Ur, POC: 10 mg/L

## 2020-11-16 ENCOUNTER — Other Ambulatory Visit (INDEPENDENT_AMBULATORY_CARE_PROVIDER_SITE_OTHER): Payer: Medicare HMO

## 2020-11-16 DIAGNOSIS — R8290 Unspecified abnormal findings in urine: Secondary | ICD-10-CM

## 2020-11-16 LAB — POCT URINALYSIS DIPSTICK
Bilirubin, UA: NEGATIVE
Glucose, UA: NEGATIVE
Ketones, UA: NEGATIVE
Nitrite, UA: NEGATIVE
Protein, UA: NEGATIVE
Spec Grav, UA: 1.02 (ref 1.010–1.025)
Urobilinogen, UA: 0.2 E.U./dL
pH, UA: 7 (ref 5.0–8.0)

## 2020-12-07 DIAGNOSIS — H25811 Combined forms of age-related cataract, right eye: Secondary | ICD-10-CM | POA: Diagnosis not present

## 2020-12-07 DIAGNOSIS — H2511 Age-related nuclear cataract, right eye: Secondary | ICD-10-CM | POA: Diagnosis not present

## 2020-12-20 ENCOUNTER — Other Ambulatory Visit: Payer: Self-pay | Admitting: Internal Medicine

## 2021-01-10 DIAGNOSIS — H2512 Age-related nuclear cataract, left eye: Secondary | ICD-10-CM | POA: Diagnosis not present

## 2021-01-10 DIAGNOSIS — H25812 Combined forms of age-related cataract, left eye: Secondary | ICD-10-CM | POA: Diagnosis not present

## 2021-01-13 HISTORY — PX: CATARACT EXTRACTION, BILATERAL: SHX1313

## 2021-02-04 DIAGNOSIS — M545 Low back pain, unspecified: Secondary | ICD-10-CM | POA: Diagnosis not present

## 2021-02-28 ENCOUNTER — Other Ambulatory Visit: Payer: Self-pay

## 2021-02-28 ENCOUNTER — Encounter: Payer: Self-pay | Admitting: Internal Medicine

## 2021-02-28 ENCOUNTER — Ambulatory Visit (INDEPENDENT_AMBULATORY_CARE_PROVIDER_SITE_OTHER): Payer: Medicare HMO | Admitting: Internal Medicine

## 2021-02-28 VITALS — BP 146/82 | HR 45 | Temp 98.0°F | Ht 65.0 in | Wt 154.4 lb

## 2021-02-28 DIAGNOSIS — L819 Disorder of pigmentation, unspecified: Secondary | ICD-10-CM

## 2021-02-28 DIAGNOSIS — R7309 Other abnormal glucose: Secondary | ICD-10-CM | POA: Diagnosis not present

## 2021-02-28 DIAGNOSIS — N182 Chronic kidney disease, stage 2 (mild): Secondary | ICD-10-CM

## 2021-02-28 DIAGNOSIS — E663 Overweight: Secondary | ICD-10-CM | POA: Diagnosis not present

## 2021-02-28 DIAGNOSIS — I129 Hypertensive chronic kidney disease with stage 1 through stage 4 chronic kidney disease, or unspecified chronic kidney disease: Secondary | ICD-10-CM | POA: Diagnosis not present

## 2021-02-28 LAB — BMP8+EGFR
BUN/Creatinine Ratio: 12 (ref 12–28)
BUN: 14 mg/dL (ref 8–27)
CO2: 24 mmol/L (ref 20–29)
Calcium: 9.9 mg/dL (ref 8.7–10.3)
Chloride: 103 mmol/L (ref 96–106)
Creatinine, Ser: 1.15 mg/dL — ABNORMAL HIGH (ref 0.57–1.00)
Glucose: 84 mg/dL (ref 65–99)
Potassium: 5 mmol/L (ref 3.5–5.2)
Sodium: 140 mmol/L (ref 134–144)
eGFR: 47 mL/min/{1.73_m2} — ABNORMAL LOW (ref >59)

## 2021-02-28 LAB — HEMOGLOBIN A1C
Est. average glucose Bld gHb Est-mCnc: 131 mg/dL
Hgb A1c MFr Bld: 6.2 % — ABNORMAL HIGH (ref 4.8–5.6)

## 2021-02-28 MED ORDER — TELMISARTAN 80 MG PO TABS
80.0000 mg | ORAL_TABLET | Freq: Every day | ORAL | 1 refills | Status: AC
Start: 2021-02-28 — End: ?

## 2021-02-28 NOTE — Progress Notes (Signed)
I,Katawbba Wiggins,acting as a Education administrator for Maximino Greenland, MD.,have documented all relevant documentation on the behalf of Maximino Greenland, MD,as directed by  Maximino Greenland, MD while in the presence of Maximino Greenland, MD.  This visit occurred during the SARS-CoV-2 public health emergency.  Safety protocols were in place, including screening questions prior to the visit, additional usage of staff PPE, and extensive cleaning of exam room while observing appropriate contact time as indicated for disinfecting solutions.  Subjective:     Patient ID: Penny Mathews , female    DOB: 1934-01-02 , 87 y.o.   MRN: [id number]   Chief Complaint  Patient presents with  . Hypertension  . dermatology referral    HPI  The patient is here today for a blood pressure f/u.  The patient would also like a referral to dermatology, her previous dermatologist Dr. Towanda Malkin has passed away.  She is unable to state her diagnosis, but states she was using Obagi products and they were helpful. She is now out of these products.                                                                                                                                         Hypertension This is a chronic problem. The current episode started more than 1 year ago. The problem has been gradually worsening since onset. The problem is controlled. Pertinent negatives include no blurred vision. Risk factors for coronary artery disease include dyslipidemia and post-menopausal state. Hypertensive end-organ damage includes kidney disease.     Past Medical History:  Diagnosis Date  . Cancer ([place])    Breast Ca - chemo & radiation 1990  . Hypertension      Family History  Problem Relation Age of Onset  . Healthy Mother   . Heart attack Father   . Lymphoma Sister   . Cancer Sister      Current Outpatient Medications:  .  amLODipine (NORVASC) 10 MG tablet, TAKE 1 TABLET EVERY MORNING, Disp: 90 tablet, Rfl: 1 .  aspirin 81 MG  tablet, Take 1 tablet (81 mg total) by mouth daily., Disp: 30 tablet, Rfl:  .  Cholecalciferol (VITAMIN D3) 50 MCG (2000 UT) capsule, Take 1 capsule (2,000 Units total) by mouth daily., Disp: 90 capsule, Rfl: 3 .  linaclotide (LINZESS) 290 MCG CAPS capsule, Take 290 mcg by mouth daily before breakfast., Disp: , Rfl:  .  pravastatin (PRAVACHOL) 40 MG tablet, Take 1 tablet (40 mg total) by mouth daily., Disp: 90 tablet, Rfl: 2 .  telmisartan (MICARDIS) 80 MG tablet, Take 1 tablet (80 mg total) by mouth daily., Disp: 90 tablet, Rfl: 1   No Known Allergies   Review of Systems  Constitutional: Negative.   Eyes: Negative for blurred vision.  Respiratory: Negative.   Cardiovascular: Negative.   Gastrointestinal: Negative.   Psychiatric/Behavioral: Negative.   All other systems  reviewed and are negative.    Today's Vitals   02/28/21 1047 02/28/21 1120  BP: (!) 154/76 (!) 146/82  Pulse: (!) 45   Temp: 98 F (36.7 C)   TempSrc: Oral   Weight: 154 lb 6.4 oz (70 kg)   Height: 5' 5" (1.651 m)   PainSc: 0-No pain    Body mass index is 25.69 kg/m.  Wt Readings from Last 3 Encounters:  02/28/21 154 lb 6.4 oz (70 kg)  10/26/20 157 lb (71.2 kg)  06/15/20 158 lb 12.8 oz (72 kg)   BP Readings from Last 3 Encounters:  02/28/21 (!) 146/82  10/26/20 (!) 144/90  06/15/20 130/72    Objective:  Physical Exam Vitals and nursing note reviewed.  Constitutional:      Appearance: Normal appearance.  HENT:     Head: Normocephalic and atraumatic.     Nose:     Comments: Masked     Mouth/Throat:     Comments: Masked  Cardiovascular:     Rate and Rhythm: Normal rate and regular rhythm.     Heart sounds: Normal heart sounds.  Pulmonary:     Effort: Pulmonary effort is normal.     Breath sounds: Normal breath sounds.  Musculoskeletal:     Cervical back: Normal range of motion.  Skin:    General: Skin is warm.     Comments: Hyperpigmentation on b/l cheeks, hypopigmentation on bridge of nose.  No macular/erythematous lesions noted.   Neurological:     General: No focal deficit present.     Mental Status: She is alert.  Psychiatric:        Mood and Affect: Mood normal.        Behavior: Behavior normal.         Assessment And Plan:     1. Hypertensive nephropathy Comments: Chronic, fair control. Importance of dietary, medication compliance was discussed with the patient. Encouraged to follow low sodium diet. She states she is taking both meds in am. Advised to take amlodipine in AM and telmisartan in the evenings. She agrees to rto in 2-3 weeks for a nurse visit. No other med changes were suggested at this time. I will check renal function.  - BMP8+EGFR  2. Chronic renal disease, stage II Comments: Chronic. Advised to stay well hydrated and keep BP well controlled to decrease risk of progression of CKD.   3. Hyperpigmentation Comments: I will refer her to [facility] for further evaluation. Not sure if this is a post-inflammatory process - Ambulatory referral to Dermatology  4. Other abnormal glucose Comments: Her a1c has been elevated in the past. I will recheck a1c today. Advised to limit intake of sugary beverages, including diet drinks.  - Hemoglobin A1c  5. Overweight with body mass index (BMI) 25.0-29.9 BMI 25. Advised to aim for at least 150 minutes of exercise per week.  Patient was given opportunity to ask questions. Patient verbalized understanding of the plan and was able to repeat key elements of the plan. All questions were answered to their satisfaction.   I, Maximino Greenland, MD, have reviewed all documentation for this visit. The documentation on 02/28/21 for the exam, diagnosis, procedures, and orders are all accurate and complete.   IF YOU HAVE BEEN REFERRED TO A SPECIALIST, IT MAY TAKE 1-2 WEEKS TO SCHEDULE/PROCESS THE REFERRAL. IF YOU HAVE NOT HEARD FROM US/SPECIALIST IN TWO WEEKS, PLEASE GIVE [place] A CALL AT [phone] X [phone].   THE PATIENT IS ENCOURAGED TO  PRACTICE SOCIAL  DISTANCING DUE TO THE COVID-19 PANDEMIC.

## 2021-02-28 NOTE — Patient Instructions (Addendum)
Please take amlodipine 10mg  in the morning  Please take telmisartan 80mg  at dinner Take pravastatin at dinner  Cooking With Less Salt Cooking with less salt is one way to reduce the amount of sodium you get from food. Sodium is one of the elements that make up salt. It is found naturally in foods and is also added to certain foods. Depending on your condition and overall health, your health care provider or dietitian may recommend that you reduce your sodium intake. Most people should have less than 2,300 milligrams (mg) of sodium each day. If you have high blood pressure (hypertension), you may need to limit your sodium to 1,500 mg each day. Follow the tips below to help reduce your sodium intake. What are tips for eating less sodium? Reading food labels  Check the food label before buying or using packaged ingredients. Always check the label for the serving size and sodium content.  Look for products with no more than 140 mg of sodium in one serving.  Check the % Daily Value column to see what percent of the daily recommended amount of sodium is provided in one serving of the product. Foods with 5% or less in this column are considered low in sodium. Foods with 20% or higher are considered high in sodium.  Do not choose foods with salt as one of the first three ingredients on the ingredients list. If salt is one of the first three ingredients, it usually means the item is high in sodium.   Shopping  Buy sodium-free or low-sodium products. Look for the following words on food labels: ? Low-sodium. ? Sodium-free. ? Reduced-sodium. ? No salt added. ? Unsalted.  Always check the sodium content even if foods are labeled as low-sodium or no salt added.  Buy fresh foods. Cooking  Use herbs, seasonings without salt, and spices as substitutes for salt.  Use sodium-free baking soda when baking.  Grill, braise, or roast foods to add flavor with less salt.  Avoid adding salt to pasta,  rice, or hot cereals.  Drain and rinse canned vegetables, beans, and meat before use.  Avoid adding salt when cooking sweets and desserts.  Cook with low-sodium ingredients. What foods are high in sodium? Vegetables Regular canned vegetables (not low-sodium or reduced-sodium). Sauerkraut, pickled vegetables, and relishes. Olives. [place] fries. Onion rings. Regular canned tomato sauce and paste. Regular tomato and vegetable juice. Frozen vegetables in sauces. Grains Instant hot cereals. Bread stuffing, pancake, and biscuit mixes. Croutons. Seasoned rice or pasta mixes. Noodle soup cups. Boxed or frozen macaroni and cheese. Regular salted crackers. Self-rising flour. Rolls. Bagels. Flour tortillas and wraps. Meats and other proteins Meat or fish that is salted, canned, smoked, cured, spiced, or pickled. This includes bacon, ham, sausages, hot dogs, corned beef, chipped beef, meat loaves, salt pork, jerky, pickled herring, anchovies, regular canned tuna, and sardines. Salted nuts. Dairy Processed cheese and cheese spreads. Cheese curds. Blue cheese. Feta cheese. String cheese. Regular cottage cheese. Buttermilk. Canned milk. The items listed above may not be a complete list of foods high in sodium. Actual amounts of sodium may be different depending on processing. Contact a dietitian for more information. What foods are low in sodium? Fruits Fresh, frozen, or canned fruit with no sauce added. Fruit juice. Vegetables Fresh or frozen vegetables with no sauce added. "No salt added" canned vegetables. "No salt added" tomato sauce and paste. Low-sodium or reduced-sodium tomato and vegetable juice. Grains Noodles, pasta, quinoa, rice. Shredded or puffed wheat or  puffed rice. Regular or quick oats (not instant). Low-sodium crackers. Low-sodium bread. Whole-grain bread and whole-grain pasta. Unsalted popcorn. Meats and other proteins Fresh or frozen whole meats, poultry (not injected with sodium), and  fish with no sauce added. Unsalted nuts. Dried peas, beans, and lentils without added salt. Unsalted canned beans. Eggs. Unsalted nut butters. Low-sodium canned tuna or chicken. Dairy Milk. Soy milk. Yogurt. Low-sodium cheeses, such as Swiss, Monterey Jack, Damar, and [employer]. Sherbet or ice cream (keep to  cup per serving). Cream cheese. Fats and oils Unsalted butter or margarine. Other foods Homemade pudding. Sodium-free baking soda and baking powder. Herbs and spices. Low-sodium seasoning mixes. Beverages Coffee and tea. Carbonated beverages. The items listed above may not be a complete list of foods low in sodium. Actual amounts of sodium may be different depending on processing. Contact a dietitian for more information. What are some salt alternatives when cooking? The following are herbs, seasonings, and spices that can be used instead of salt to flavor your food. Herbs should be fresh or dried. Do not choose packaged mixes. Next to the name of the herb, spice, or seasoning are some examples of foods you can pair it with. Herbs  Bay leaves - Soups, meat and vegetable dishes, and spaghetti sauce.  Basil - [employer], soups, pasta, and fish dishes.  Cilantro - Meat, poultry, and vegetable dishes.  Chili powder - Marinades and Mexican dishes.  Chives - Salad dressings and potato dishes.  Cumin - Mexican dishes, couscous, and meat dishes.  Dill - Fish dishes, sauces, and salads.  Fennel - Meat and vegetable dishes, breads, and cookies.  Garlic (do not use garlic salt) - [place] dishes, meat dishes, salad dressings, and sauces.  Marjoram - Soups, potato dishes, and meat dishes.  Oregano - Pizza and spaghetti sauce.  Parsley - Salads, soups, pasta, and meat dishes.  Rosemary - [place] dishes, salad dressings, soups, and red meats.  Saffron - Fish dishes, pasta, and some poultry dishes.  Sage - Stuffings and sauces.  Tarragon - Fish and [employer].  Thyme -  Stuffing, meat, and fish dishes. Seasonings  Lemon juice - Fish dishes, poultry dishes, vegetables, and salads.  Vinegar - Salad dressings, vegetables, and fish dishes. Spices  Cinnamon - Sweet dishes, such as cakes, cookies, and puddings.  Cloves - Gingerbread, puddings, and marinades for meats.  Curry - Vegetable dishes, fish and poultry dishes, and stir-fry dishes.  Ginger - Vegetable dishes, fish dishes, and stir-fry dishes.  Nutmeg - Pasta, vegetables, poultry, fish dishes, and custard. Summary  Cooking with less salt is one way to reduce the amount of sodium that you get from food.  Buy sodium-free or low-sodium products.  Check the food label before using or buying packaged ingredients.  Use herbs, seasonings without salt, and spices as substitutes for salt in foods. This information is not intended to replace advice given to you by your health care provider. Make sure you discuss any questions you have with your health care provider. Document Revised: 09/23/2019 Document Reviewed: 09/23/2019 Elsevier Patient Education  [place].

## 2021-03-22 ENCOUNTER — Other Ambulatory Visit: Payer: Self-pay

## 2021-03-22 ENCOUNTER — Ambulatory Visit (INDEPENDENT_AMBULATORY_CARE_PROVIDER_SITE_OTHER): Payer: Medicare HMO | Admitting: Internal Medicine

## 2021-03-22 ENCOUNTER — Encounter: Payer: Self-pay | Admitting: Internal Medicine

## 2021-03-22 VITALS — BP 136/78 | HR 95 | Temp 98.1°F | Ht 65.0 in | Wt 149.8 lb

## 2021-03-22 DIAGNOSIS — I129 Hypertensive chronic kidney disease with stage 1 through stage 4 chronic kidney disease, or unspecified chronic kidney disease: Secondary | ICD-10-CM | POA: Diagnosis not present

## 2021-03-22 DIAGNOSIS — R3989 Other symptoms and signs involving the genitourinary system: Secondary | ICD-10-CM

## 2021-03-22 DIAGNOSIS — N182 Chronic kidney disease, stage 2 (mild): Secondary | ICD-10-CM

## 2021-03-22 NOTE — Patient Instructions (Signed)

## 2021-03-22 NOTE — Progress Notes (Signed)
I,Katawbba Wiggins,acting as a Education administrator for Maximino Greenland, MD.,have documented all relevant document  This visit occurred during the SARS-CoV-2 public health emergency.  Safety protocols were in place, including screening questions prior to the visit, additional usage of staff PPE, and extensive cleaning of exam room while observing appropriate contact time as indicated for disinfecting solutions.  Subjective:     Patient ID: Penny Mathews , female    DOB: 11-Mar-1934 , 87 y.o.   MRN: [id number]   Chief Complaint  Patient presents with   Hypertension    HPI  The patient is here today for a blood pressure f/u.  She is now taking amlodipine in AM and telmisartan in PM. She feels well on this regimen.   She is also concerned with her the color of her urine being dark. She states her sx started a month ago. Denies urgency,dysuria and frequency. She states she is taking medication from her dentist 3 pills 3 times per day. She was told it would help with bone growth, she is now undergoing implant procedure.                                                                                                                  Hypertension This is a new problem. The problem has been gradually worsening since onset. The problem is controlled. Pertinent negatives include no blurred vision. Risk factors for coronary artery disease include dyslipidemia and post-menopausal state. Hypertensive end-organ damage includes kidney disease.    Past Medical History:  Diagnosis Date   Cancer ([place])    Breast Ca - chemo & radiation 1990   Hypertension      Family History  Problem Relation Age of Onset   Healthy Mother    Heart attack Father    Lymphoma Sister    Cancer Sister      Current Outpatient Medications:    amLODipine (NORVASC) 10 MG tablet, TAKE 1 TABLET EVERY MORNING, Disp: 90 tablet, Rfl: 1   aspirin 81 MG tablet, Take 1 tablet (81 mg total) by mouth daily., Disp: 30 tablet, Rfl:     Cholecalciferol (VITAMIN D3) 50 MCG (2000 UT) capsule, Take 1 capsule (2,000 Units total) by mouth daily., Disp: 90 capsule, Rfl: 3   pravastatin (PRAVACHOL) 40 MG tablet, Take 1 tablet (40 mg total) by mouth daily., Disp: 90 tablet, Rfl: 2   Sennosides (EX-LAX PO), Take by mouth. prn, Disp: , Rfl:    telmisartan (MICARDIS) 80 MG tablet, Take 1 tablet (80 mg total) by mouth daily., Disp: 90 tablet, Rfl: 1   No Known Allergies   Review of Systems  Constitutional: Negative.   Eyes:  Negative for blurred vision.  Respiratory: Negative.    Cardiovascular: Negative.   Gastrointestinal: Negative.   Neurological: Negative.   Psychiatric/Behavioral: Negative.      Today's Vitals   03/22/21 1421  BP: 136/78  Pulse: 95  Temp: 98.1 F (36.7 C)  TempSrc: Oral  Weight: 149 lb 12.8 oz (67.9 kg)  Height:  5\' 5"  (1.651 m)  PainSc: 0-No pain   Body mass index is 24.93 kg/m.  Wt Readings from Last 3 Encounters:  03/22/21 149 lb 12.8 oz (67.9 kg)  02/28/21 154 lb 6.4 oz (70 kg)  10/26/20 157 lb (71.2 kg)   BP Readings from Last 3 Encounters:  03/22/21 136/78  02/28/21 (!) 146/82  10/26/20 (!) 144/90   Objective:  Physical Exam Vitals and nursing note reviewed.  Constitutional:      Appearance: Normal appearance.  HENT:     Head: Normocephalic and atraumatic.     Nose:     Comments: Masked     Mouth/Throat:     Comments: Masked  Cardiovascular:     Rate and Rhythm: Normal rate and regular rhythm.     Heart sounds: Normal heart sounds.  Pulmonary:     Effort: Pulmonary effort is normal.     Breath sounds: Normal breath sounds.  Skin:    General: Skin is warm.  Neurological:     General: No focal deficit present.     Mental Status: She is alert.  Psychiatric:        Mood and Affect: Mood normal.        Behavior: Behavior normal.        Assessment And Plan:     1. Hypertensive nephropathy Comments: Chronic, fair control. Pt is aware that goal BP is less than 130/80.  Advised to follow low sodium diet.   2. Chronic renal disease, stage II Comments: Chronic, encouraged to stay well hydrated and keep BP controlled to prevent progression of CKD.   3. Urine discoloration Comments: I will check urinalysis today. She will call back today with name of "med/supplement"she is taking for her teeth.  - POCT Urinalysis Dipstick (67591)   Patient was given opportunity to ask questions. Patient verbalized understanding of the plan and was able to repeat key elements of the plan. All questions were answered to their satisfaction.   I, Maximino Greenland, MD, have reviewed all documentation for this visit. The documentation on 03/22/21 for the exam, diagnosis, procedures, and orders are all accurate and complete.   IF YOU HAVE BEEN REFERRED TO A SPECIALIST, IT MAY TAKE 1-2 WEEKS TO SCHEDULE/PROCESS THE REFERRAL. IF YOU HAVE NOT HEARD FROM US/SPECIALIST IN TWO WEEKS, PLEASE GIVE [place] A CALL AT [phone].   THE PATIENT IS ENCOURAGED TO PRACTICE SOCIAL DISTANCING DUE TO THE COVID-19 PANDEMIC.  ation on the behalf of Maximino Greenland, MD,as directed by  Maximino Greenland, MD while in the presence of Maximino Greenland, MD.

## 2021-03-23 LAB — POCT URINALYSIS DIPSTICK
Bilirubin, UA: NEGATIVE
Blood, UA: NEGATIVE
Glucose, UA: NEGATIVE
Nitrite, UA: NEGATIVE
Protein, UA: NEGATIVE
Spec Grav, UA: 1.02 (ref 1.010–1.025)
Urobilinogen, UA: 0.2 E.U./dL
pH, UA: 7 (ref 5.0–8.0)

## 2021-03-24 ENCOUNTER — Telehealth: Payer: Self-pay

## 2021-03-24 NOTE — Telephone Encounter (Signed)
Patient notified

## 2021-03-24 NOTE — Telephone Encounter (Signed)
-----   Message from Glendale Chard, MD sent at 03/23/2021 11:10 PM EDT ----- Penny Mathews. She may need to increase her water intake since her dentist has her taking so many pills daily.   RS ----- Message ----- From: Michelle Nasuti, Nambe Sent: 03/23/2021  11:12 AM EDT To: Glendale Chard, MD  The pt left a message that the med she is taking that she was taking about at her visit was Standard Process Cataplexacp Vascular Integrity

## 2021-04-26 ENCOUNTER — Other Ambulatory Visit: Payer: Self-pay

## 2021-04-26 ENCOUNTER — Emergency Department (HOSPITAL_COMMUNITY)
Admission: EM | Admit: 2021-04-26 | Discharge: 2021-05-15 | Disposition: E | Payer: Medicare HMO | Attending: Emergency Medicine | Admitting: Emergency Medicine

## 2021-04-26 DIAGNOSIS — R008 Other abnormalities of heart beat: Secondary | ICD-10-CM | POA: Diagnosis not present

## 2021-04-26 DIAGNOSIS — I469 Cardiac arrest, cause unspecified: Secondary | ICD-10-CM | POA: Diagnosis not present

## 2021-04-26 DIAGNOSIS — R404 Transient alteration of awareness: Secondary | ICD-10-CM | POA: Diagnosis not present

## 2021-04-26 DIAGNOSIS — Z87891 Personal history of nicotine dependence: Secondary | ICD-10-CM | POA: Insufficient documentation

## 2021-04-26 DIAGNOSIS — Z7982 Long term (current) use of aspirin: Secondary | ICD-10-CM | POA: Insufficient documentation

## 2021-04-26 DIAGNOSIS — D649 Anemia, unspecified: Secondary | ICD-10-CM | POA: Diagnosis not present

## 2021-04-26 DIAGNOSIS — Z853 Personal history of malignant neoplasm of breast: Secondary | ICD-10-CM | POA: Insufficient documentation

## 2021-04-26 DIAGNOSIS — N182 Chronic kidney disease, stage 2 (mild): Secondary | ICD-10-CM | POA: Diagnosis not present

## 2021-04-26 DIAGNOSIS — I129 Hypertensive chronic kidney disease with stage 1 through stage 4 chronic kidney disease, or unspecified chronic kidney disease: Secondary | ICD-10-CM | POA: Diagnosis not present

## 2021-04-26 DIAGNOSIS — I499 Cardiac arrhythmia, unspecified: Secondary | ICD-10-CM | POA: Diagnosis not present

## 2021-04-26 DIAGNOSIS — R4020 Unspecified coma: Secondary | ICD-10-CM | POA: Diagnosis not present

## 2021-04-26 DIAGNOSIS — Z79899 Other long term (current) drug therapy: Secondary | ICD-10-CM | POA: Insufficient documentation

## 2021-04-26 DIAGNOSIS — I4901 Ventricular fibrillation: Secondary | ICD-10-CM | POA: Diagnosis not present

## 2021-04-26 DIAGNOSIS — R0689 Other abnormalities of breathing: Secondary | ICD-10-CM | POA: Diagnosis not present

## 2021-04-26 LAB — CBC WITH DIFFERENTIAL/PLATELET
Abs Immature Granulocytes: 0.07 10*3/uL (ref 0.00–0.07)
Basophils Absolute: 0 10*3/uL (ref 0.0–0.1)
Basophils Relative: 1 %
Eosinophils Absolute: 0 10*3/uL (ref 0.0–0.5)
Eosinophils Relative: 1 %
HCT: 31 % — ABNORMAL LOW (ref 36.0–46.0)
Hemoglobin: 9 g/dL — ABNORMAL LOW (ref 12.0–15.0)
Immature Granulocytes: 1 %
Lymphocytes Relative: 49 %
Lymphs Abs: 2.4 10*3/uL (ref 0.7–4.0)
MCH: 30.3 pg (ref 26.0–34.0)
MCHC: 29 g/dL — ABNORMAL LOW (ref 30.0–36.0)
MCV: 104.4 fL — ABNORMAL HIGH (ref 80.0–100.0)
Monocytes Absolute: 0.3 10*3/uL (ref 0.1–1.0)
Monocytes Relative: 6 %
Neutro Abs: 2.1 10*3/uL (ref 1.7–7.7)
Neutrophils Relative %: 42 %
Platelets: 66 10*3/uL — ABNORMAL LOW (ref 150–400)
RBC: 2.97 MIL/uL — ABNORMAL LOW (ref 3.87–5.11)
RDW: 13.2 % (ref 11.5–15.5)
WBC: 5 10*3/uL (ref 4.0–10.5)
nRBC: 1 % — ABNORMAL HIGH (ref 0.0–0.2)

## 2021-04-26 LAB — COMPREHENSIVE METABOLIC PANEL
ALT: 19 U/L (ref 0–44)
AST: 42 U/L — ABNORMAL HIGH (ref 15–41)
Albumin: 2.6 g/dL — ABNORMAL LOW (ref 3.5–5.0)
Alkaline Phosphatase: 66 U/L (ref 38–126)
Anion gap: 16 — ABNORMAL HIGH (ref 5–15)
BUN: 18 mg/dL (ref 8–23)
CO2: 17 mmol/L — ABNORMAL LOW (ref 22–32)
Calcium: >15 mg/dL (ref 8.9–10.3)
Chloride: 114 mmol/L — ABNORMAL HIGH (ref 98–111)
Creatinine, Ser: 1.61 mg/dL — ABNORMAL HIGH (ref 0.44–1.00)
GFR, Estimated: 31 mL/min — ABNORMAL LOW (ref >60)
Glucose, Bld: 341 mg/dL — ABNORMAL HIGH (ref 70–99)
Potassium: 5.5 mmol/L — ABNORMAL HIGH (ref 3.5–5.1)
Sodium: 147 mmol/L — ABNORMAL HIGH (ref 135–145)
Total Bilirubin: 0.6 mg/dL (ref 0.3–1.2)
Total Protein: 4.7 g/dL — ABNORMAL LOW (ref 6.5–8.1)

## 2021-04-26 LAB — I-STAT CHEM 8, ED
BUN: 20 mg/dL (ref 8–23)
Calcium, Ion: 2.41 mmol/L (ref 1.15–1.40)
Chloride: 117 mmol/L — ABNORMAL HIGH (ref 98–111)
Creatinine, Ser: 1.4 mg/dL — ABNORMAL HIGH (ref 0.44–1.00)
Glucose, Bld: 310 mg/dL — ABNORMAL HIGH (ref 70–99)
HCT: 25 % — ABNORMAL LOW (ref 36.0–46.0)
Hemoglobin: 8.5 g/dL — ABNORMAL LOW (ref 12.0–15.0)
Potassium: 5.2 mmol/L — ABNORMAL HIGH (ref 3.5–5.1)
Sodium: 144 mmol/L (ref 135–145)
TCO2: 20 mmol/L — ABNORMAL LOW (ref 22–32)

## 2021-04-26 LAB — TROPONIN I (HIGH SENSITIVITY): Troponin I (High Sensitivity): 2431 ng/L (ref <18)

## 2021-04-26 LAB — CBG MONITORING, ED: Glucose-Capillary: 91 mg/dL (ref 70–99)

## 2021-04-26 MED ORDER — CALCIUM CHLORIDE 10 % IV SOLN
INTRAVENOUS | Status: AC | PRN
  Administered 2021-04-26: 1 g via INTRAVENOUS

## 2021-04-26 MED ORDER — EPINEPHRINE 1 MG/10ML IJ SOSY
PREFILLED_SYRINGE | INTRAMUSCULAR | Status: AC | PRN
  Administered 2021-04-26 (×2): 1 mg via INTRAVENOUS

## 2021-04-26 MED ORDER — ATROPINE SULFATE 1 MG/ML IJ SOLN
INTRAMUSCULAR | Status: AC | PRN
  Administered 2021-04-26: 1 mg via INTRAVENOUS

## 2021-04-26 MED ORDER — EPINEPHRINE 1 MG/10ML IJ SOSY
PREFILLED_SYRINGE | INTRAMUSCULAR | Status: AC | PRN
  Administered 2021-04-26: 1 mg via INTRAVENOUS

## 2021-05-15 NOTE — ED Provider Notes (Signed)
[facility] EMERGENCY DEPARTMENT Provider Note   CSN: [id number] Arrival date & time: 05-25-2021  1033     History No chief complaint on file.   Penny Mathews is a 87 y.o. female.  HPI  This patient is an 87 year old female, per the medical record in the electronic health record the patient has a history of hypertensive nephropathy, hypertension, prior history of breast cancer over 30 years ago, she has had a prior history of a stroke.  She presents to the hospital today with a complaint of cardiac arrest.  This occurred while she was at an aerobics class at the senior center according to the paramedics.  Evidently the patient stated that she had chest pain clutched her chest and then fell to the ground.  When the paramedics arrived she appeared to be in ventricular fibrillation.  She had a defibrillation which set her back into what appeared to be a normal electrical rhythm however there was no pulses and CPR was continued the entire route to the hospital.  She was given 4 doses of prehospital epinephrine through an intraosseous line in her left proximal tibia but there is no return of spontaneous circulation.  The estimated downtime was approximately 10:00 AM as recorded by the EMS call.  Level 5 caveat applies due to the unresponsiveness of the patient on arrival  Past Medical History:  Diagnosis Date   Cancer [facility])    Breast Ca - chemo & radiation 1990   Hypertension     Patient Active Problem List   Diagnosis Date Noted   Hypertensive nephropathy 09/21/2018   Chronic renal disease, stage II 09/21/2018   Pure hypercholesterolemia 09/21/2018   Other abnormal glucose 09/21/2018   Left thyroid nodule 12/05/2015   Mass of thyroid region 12/05/2015   CVA (cerebral infarction) 11/07/2013   Hypertension 11/07/2013   Stroke ([place]) 11/07/2013    Past Surgical History:  Procedure Laterality Date   ABDOMINAL HYSTERECTOMY     BREAST LUMPECTOMY     CATARACT  EXTRACTION, BILATERAL Bilateral 01/2021   CERVICAL FUSION     Fractured Leg x 2       OB History   No obstetric history on file.     Family History  Problem Relation Age of Onset   Healthy Mother    Heart attack Father    Lymphoma Sister    Cancer Sister     Social History   Tobacco Use   Smoking status: Former    Packs/day: 0.12    Years: 50.00    Pack years: 6.00    Types: Cigarettes    Start date: 03/31/1961    Quit date: 12/13/2020    Years since quitting: 0.3   Smokeless tobacco: Never   Tobacco comments:    encouraged to cut back on number of cigs smoked/day  Vaping Use   Vaping Use: Never used  Substance Use Topics   Alcohol use: Yes    Comment: on occasion   Drug use: Not Currently    Home Medications Prior to Admission medications   Medication Sig Start Date End Date Taking? Authorizing Provider  amLODipine (NORVASC) 10 MG tablet TAKE 1 TABLET EVERY MORNING 12/20/20   Glendale Chard, MD  aspirin 81 MG tablet Take 1 tablet (81 mg total) by mouth daily. 11/09/13   Geradine Girt, DO  Cholecalciferol (VITAMIN D3) 50 MCG (2000 UT) capsule Take 1 capsule (2,000 Units total) by mouth daily. 11/01/20   Glendale Chard, MD  pravastatin (PRAVACHOL) 40 MG tablet Take 1 tablet (40 mg total) by mouth daily. 03/31/20   Glendale Chard, MD  Sennosides (EX-LAX PO) Take by mouth. prn    [provider]  telmisartan (MICARDIS) 80 MG tablet Take 1 tablet (80 mg total) by mouth daily. 02/28/21   Glendale Chard, MD    Allergies    Patient has no known allergies.  Review of Systems   Review of Systems  Unable to perform ROS: Mental status change   Physical Exam Updated Vital Signs Pulse (!) 0   Resp (!) 0   Ht 1.651 m (_0 )   Wt 70 kg   BMI 25.68 kg/m   Physical Exam Constitutional:      General: She is in acute distress.     Appearance: She is ill-appearing and toxic-appearing.  HENT:     Head: Normocephalic and atraumatic.     Nose: Nose normal.      Mouth/Throat:     Mouth: Mucous membranes are moist.     Pharynx: No oropharyngeal exudate or posterior oropharyngeal erythema.  Eyes:     Comments: Pupils are 4 mm and nonreactive bilaterally  Neck:     Comments: JVD present Cardiovascular:     Comments: Sinus tachycardia on the monitor but no pulses Pulmonary:     Comments: No spontaneous respirations, requiring assisted ventilation through [facility] airway placed by EMS Abdominal:     Comments: Soft abdomen, no masses  Musculoskeletal:     Cervical back: Normal range of motion.     Comments: No edema, 4 extremities appear normal, intraosseous line present in the left proximal tibia  Skin:    Comments: Cool dry no rash  Neurological:     Comments: Unresponsive, GCS of 3    ED Results / Procedures / Treatments   Labs (all labs ordered are listed, but only abnormal results are displayed) Labs Reviewed  I-STAT CHEM 8, ED - Abnormal; Notable for the following components:      Result Value   Potassium 5.2 (*)    Chloride 117 (*)    Creatinine, Ser 1.40 (*)    Glucose, Bld 310 (*)    Calcium, Ion 2.41 (*)    TCO2 20 (*)    Hemoglobin 8.5 (*)    HCT 25.0 (*)    All other components within normal limits  CBC WITH DIFFERENTIAL/PLATELET  COMPREHENSIVE METABOLIC PANEL  CBG MONITORING, ED  TROPONIN I (HIGH SENSITIVITY)    EKG None  Radiology No results found.  Procedures CPR  Date/Time: May 08, 2021 11:01 AM Performed by: Noemi Chapel, MD Authorized by: Noemi Chapel, MD  CPR Procedure Details:      Amount of time prior to administration of ACLS/BLS (minutes):  0   ACLS/BLS initiated by EMS: Yes     CPR/ACLS performed in the ED: Yes     Duration of CPR (minutes):  15   Outcome: Pt declared dead    CPR performed via ACLS guidelines under my direct supervision.  See RN documentation for details including defibrillator use, medications, doses and timing. Comments:       Procedure Name: Intubation Date/Time: 05-08-21  11:02 AM Performed by: Noemi Chapel, MD Pre-anesthesia Checklist: Patient identified, Patient being monitored, Emergency Drugs available, Timeout performed and Suction available Oxygen Delivery Method: Non-rebreather mask Preoxygenation: Pre-oxygenation with 100% oxygen Laryngoscope Size: Mac and 4 Grade View: Grade II Tube size: 7.0 mm Number of attempts: 1 Airway Equipment and Method: Stylet (Directly Koska P with  associated styloid) Placement Confirmation: ETT inserted through vocal cords under direct vision, CO2 detector and Breath sounds checked- equal and bilateral Secured at: 22 cm Tube secured with: ETT holder Dental Injury: Teeth and Oropharynx as per pre-operative assessment  Difficulty Due To: Difficulty was unanticipated Comments:      Ultrasound ED Peripheral IV (Provider)  Date/Time: 2021-05-26 11:03 AM Performed by: Noemi Chapel, MD Authorized by: Noemi Chapel, MD   Procedure details:    Indications: poor IV access     Skin Prep: isopropyl alcohol     Location: left EJ.   Angiocath:  18 G   Bedside Ultrasound Guided: No     Patient tolerated procedure without complications: Yes     Dressing applied: Yes   Comments:         Medications Ordered in ED Medications  EPINEPHrine (ADRENALIN) 1 MG/10ML injection (1 mg Intravenous Given 2021-05-26 1035)  EPINEPHrine (ADRENALIN) 1 MG/10ML injection (1 mg Intravenous Given 05-26-2021 1042)  calcium chloride injection (1 g Intravenous Given 05/26/2021 1037)  atropine injection (1 mg Intravenous Given 05-26-2021 1037)    ED Course  I have reviewed the triage vital signs and the nursing notes.  Pertinent labs & imaging results that were available during my care of the patient were reviewed by me and considered in my medical decision making (see chart for details).    MDM Rules/Calculators/A&P                          This patient is critically ill, unfortunately she had multiple doses of medication prehospital, she had  continuous CPR and still had no return of spontaneous circulation.  On arrival the patient had discontinued, I directed CPR, I gave 2 more doses of epinephrine, I placed an external jugular IV on the left side, 18-gauge and ordered labs, the chemistry came back as we continued resuscitation and showed a potassium of 5.2, creatinine of 1.4, hemoglobin of 8.5.  Despite continued CPR, despite more epinephrine the patient continued to be in PEA.  An ultrasound was performed of her heart and there was absolutely no cardiac motion.  With downtime of over 15 minutes it was decided to declare the patient dead at 10:52 AM.  We will contact with medical examiner given the patient's limited medical history though this does not sound suspicious and I SPECT a cardiac event.  I discussed the case with the medical examiner, Izora Ribas at 11:20 AM, he agrees that this is not a medical examiner's case, natural death  Final Clinical Impression(s) / ED Diagnoses Final diagnoses:  Cardiac arrest ([place])  Pulseless electrical activity ([place])  Anemia, unspecified type     Noemi Chapel, MD 05/26/2021 1121

## 2021-05-15 NOTE — Code Documentation (Signed)
Pulse absent, CPR resumed

## 2021-05-15 NOTE — Code Documentation (Signed)
Patient time of death occurred at 40.

## 2021-05-15 NOTE — ED Triage Notes (Signed)
Pt via EMS from [employer] class. Had witnessed arrest, falling to the ground and clutching her chest. Immediate CPR started by bystanders. Initial rhythm VF, shocked once, and now in PEA. King airway in place, CPR still in progress on arrival to ED.

## 2021-05-15 DEATH — deceased

## 2021-06-22 ENCOUNTER — Ambulatory Visit: Payer: Medicare HMO | Admitting: Internal Medicine

## 2021-10-30 ENCOUNTER — Encounter: Payer: Medicare HMO | Admitting: Internal Medicine
